# Patient Record
Sex: Male | Born: 1990
Health system: Southern US, Community
[De-identification: ages and names within clinical notes are randomized; demographics above are authoritative.]

## PROBLEM LIST (undated history)

## (undated) DIAGNOSIS — D869 Sarcoidosis, unspecified: Secondary | ICD-10-CM

## (undated) DIAGNOSIS — E611 Iron deficiency: Secondary | ICD-10-CM

## (undated) HISTORY — DX: Sarcoidosis, unspecified: D86.9

---

## 2016-11-15 DIAGNOSIS — M2550 Pain in unspecified joint: Secondary | ICD-10-CM | POA: Diagnosis not present

## 2016-11-15 DIAGNOSIS — M791 Myalgia: Secondary | ICD-10-CM | POA: Diagnosis not present

## 2017-01-06 ENCOUNTER — Encounter (HOSPITAL_COMMUNITY): Payer: Self-pay | Admitting: *Deleted

## 2017-01-06 ENCOUNTER — Emergency Department (HOSPITAL_COMMUNITY): Payer: BLUE CROSS/BLUE SHIELD

## 2017-01-06 ENCOUNTER — Emergency Department (HOSPITAL_COMMUNITY)
Admission: EM | Admit: 2017-01-06 | Discharge: 2017-01-06 | Disposition: A | Discharge status: Home / Self Care | Payer: BLUE CROSS/BLUE SHIELD | Attending: Emergency Medicine | Admitting: Emergency Medicine

## 2017-01-06 DIAGNOSIS — R0609 Other forms of dyspnea: Secondary | ICD-10-CM | POA: Insufficient documentation

## 2017-01-06 DIAGNOSIS — M2550 Pain in unspecified joint: Secondary | ICD-10-CM | POA: Insufficient documentation

## 2017-01-06 DIAGNOSIS — R059 Cough, unspecified: Secondary | ICD-10-CM

## 2017-01-06 DIAGNOSIS — R05 Cough: Secondary | ICD-10-CM | POA: Diagnosis not present

## 2017-01-06 DIAGNOSIS — F17210 Nicotine dependence, cigarettes, uncomplicated: Secondary | ICD-10-CM | POA: Insufficient documentation

## 2017-01-06 DIAGNOSIS — R0600 Dyspnea, unspecified: Secondary | ICD-10-CM

## 2017-01-06 DIAGNOSIS — M791 Myalgia: Secondary | ICD-10-CM | POA: Diagnosis not present

## 2017-01-06 DIAGNOSIS — R0602 Shortness of breath: Secondary | ICD-10-CM | POA: Diagnosis not present

## 2017-01-06 LAB — CBC WITH DIFFERENTIAL/PLATELET
Basophils Absolute: 0.1 10*3/uL (ref 0.0–0.1)
Basophils Relative: 1 %
Eosinophils Absolute: 0.3 10*3/uL (ref 0.0–0.7)
Eosinophils Relative: 3 %
HCT: 34.1 % — ABNORMAL LOW (ref 39.0–52.0)
Hemoglobin: 10.9 g/dL — ABNORMAL LOW (ref 13.0–17.0)
Lymphocytes Relative: 28 %
Lymphs Abs: 2.3 10*3/uL (ref 0.7–4.0)
MCH: 22.1 pg — ABNORMAL LOW (ref 26.0–34.0)
MCHC: 32 g/dL (ref 30.0–36.0)
MCV: 69.2 fL — ABNORMAL LOW (ref 78.0–100.0)
Monocytes Absolute: 0.5 10*3/uL (ref 0.1–1.0)
Monocytes Relative: 6 %
Neutro Abs: 5.1 10*3/uL (ref 1.7–7.7)
Neutrophils Relative %: 62 %
Platelets: 436 10*3/uL — ABNORMAL HIGH (ref 150–400)
RBC: 4.93 MIL/uL (ref 4.22–5.81)
RDW: 16.1 % — ABNORMAL HIGH (ref 11.5–15.5)
WBC: 8.3 10*3/uL (ref 4.0–10.5)

## 2017-01-06 LAB — COMPREHENSIVE METABOLIC PANEL
ALT: 76 U/L — ABNORMAL HIGH (ref 17–63)
AST: 52 U/L — ABNORMAL HIGH (ref 15–41)
Albumin: 3.5 g/dL (ref 3.5–5.0)
Alkaline Phosphatase: 94 U/L (ref 38–126)
Anion gap: 11 (ref 5–15)
BUN: 14 mg/dL (ref 6–20)
CO2: 21 mmol/L — ABNORMAL LOW (ref 22–32)
Calcium: 9.3 mg/dL (ref 8.9–10.3)
Chloride: 103 mmol/L (ref 101–111)
Creatinine, Ser: 0.86 mg/dL (ref 0.61–1.24)
GFR calc Af Amer: >60 mL/min (ref >60)
GFR calc non Af Amer: >60 mL/min (ref >60)
Glucose, Bld: 92 mg/dL (ref 65–99)
Potassium: 3.6 mmol/L (ref 3.5–5.1)
Sodium: 135 mmol/L (ref 135–145)
Total Bilirubin: 0.6 mg/dL (ref 0.3–1.2)
Total Protein: 7.7 g/dL (ref 6.5–8.1)

## 2017-01-06 LAB — TSH: TSH: 3.476 u[IU]/mL (ref 0.350–4.500)

## 2017-01-06 MED ORDER — FAMOTIDINE 20 MG PO TABS: 20.0000 mg | ORAL_TABLET | Freq: Two times a day (BID) | ORAL | 0 refills | Status: DC

## 2017-01-06 MED ORDER — PREDNISONE 10 MG PO TABS: ORAL_TABLET | ORAL | 0 refills | Status: DC

## 2017-01-06 MED ORDER — DIPHENHYDRAMINE HCL 25 MG PO CAPS: 25.0000 mg | ORAL_CAPSULE | Freq: Four times a day (QID) | ORAL | 0 refills | Status: DC | PRN

## 2017-01-06 NOTE — Discharge Instructions (Addendum)
Your chest x-ray showed changes that are consistent with sarcoidosis. This will require follow-up with a chest CT for further evaluation. This may be obtained by a primary care doctor or a pulmonologist. Follow up with either primary care or a pulmonologist for reevaluation of your symptoms and to obtain imaging. Alternate 600 mg of ibuprofen and [phone] mg of Tylenol every 3 hours as needed for your joint pains. Do not exceed 4000 mg of Tylenol daily. You may also apply ice or heat to the affected areas for comfort. Return to the ED immediately if any concerning signs or symptoms develop such as worsening persistent shortness of breath, fevers.

## 2017-01-06 NOTE — ED Notes (Signed)
Pt c/o cough x 3weeks . Also c/o swelling and joint pain that increses and decreases at extremity sites over last several months. Pt c/o sweling at left great toe.

## 2017-01-06 NOTE — ED Notes (Signed)
Pt states he understandsinstructions. Home stable with steadfy gait.

## 2017-01-06 NOTE — ED Triage Notes (Addendum)
Pt c/o chronic cough worse when in front of an air condition, pt c/o joint pain x3 mths & lack of appetite onset x 3 wks, pt denies SOB, denies n/v/d, A&O x4

## 2017-01-06 NOTE — ED Notes (Signed)
Pt returns from xray

## 2017-01-06 NOTE — ED Notes (Signed)
Patient transported to X-ray 

## 2017-01-06 NOTE — ED Provider Notes (Signed)
[facility]-EMERGENCY DEPT Provider Note   CSN: [id number] Arrival date & time: 01/06/17  7829  By signing my name below, I, Diona Browner, attest that this documentation has been prepared under the direction and in the presence of [facility], PA-C. Electronically Signed: Diona Browner, ED Scribe. 01/06/17. 1:03 PM.  History   Chief Complaint Chief Complaint  Patient presents with  . Joint Pain    HPI Ronald Walton is a 26 y.o. male who presents to the Emergency Department with multiple complaints. Firstly, he is complaining of intermittent, aching, joint pain to multiple joints for the last three months. Pt reports sudden right ankle swelling initially with no known injury that started in May 2018 and resolved after a few days. He took tylenol and the swelling went down. Since then, he states that he has been experiencing intermittent transient joint aches and swelling in multiple joints to his hands and feet. Today, he complains of aching pain in the left MCP joint and the balls of his feet. No trauma or falls. No numbness, tingling, or weakness. Associated sx include decreased appetite, unexpected weight loss (40 pounds in ~ 3 months), and muscle cramps in the thighs which occur at rest but are relieved by movement.  He also endorses cough for 3 weeks. Initially dry, now productive of green mucus intermittently. Cough is worse in an air conditioned setting. He has tried over-the-counter cough medication which has been somewhat helpful. Associated symptoms include chest tightness and DOE. Smokes 2 black and milds a day since he was 16. Smokes marijuana. Drinks EtOH socially. No other drug use.   Pt denies fever, abdominal pain, nausea, vomiting, diarrhea, or any other complaints at this time.   The history is provided by the patient. No language interpreter was used.    History reviewed. No pertinent past medical history.  There are no active problems to display for this  patient.   History reviewed. No pertinent surgical history.     Home Medications    Prior to Admission medications   Not on File    Family History No family history on file.  Social History Social History  Substance Use Topics  . Smoking status: Current Every Day Smoker    Packs/day: 0.25    Types: Cigarettes  . Smokeless tobacco: Never Used  . Alcohol use No     Allergies   Patient has no known allergies.   Review of Systems Review of Systems  Constitutional: Positive for activity change and unexpected weight change. Negative for fever.  Respiratory: Positive for cough and shortness of breath.   Cardiovascular: Positive for chest pain.  Gastrointestinal: Negative for abdominal pain, diarrhea, nausea and vomiting.  Musculoskeletal: Positive for arthralgias, joint swelling and myalgias.  All other systems reviewed and are negative.    Physical Exam Updated Vital Signs BP 138/69   Pulse 99   Temp 98.9 F (37.2 C)   Resp 16   SpO2 100%   Physical Exam  Constitutional: He appears well-developed and well-nourished. No distress.  Resting comfortably in chair  HENT:  Head: Normocephalic and atraumatic.  Eyes: Conjunctivae are normal. Right eye exhibits no discharge. Left eye exhibits no discharge.  Neck: Normal range of motion. Neck supple. No JVD present. No tracheal deviation present.  Cardiovascular: Normal rate, regular rhythm, normal heart sounds and intact distal pulses.   2+ radial and DP/PT pulses bl, negative Homan's bl   Pulmonary/Chest: Effort normal and breath sounds normal. No respiratory distress. He has  no wheezes. He has no rales. He exhibits no tenderness.  Abdominal: Soft. He exhibits no distension. There is no tenderness.  Musculoskeletal: Normal range of motion. He exhibits tenderness. He exhibits no edema.  Left first MTP joint tenderness palpation without warmth, erythema, or crepitus. No swelling appreciated to the extremities or  joints. Normal range of motion and 5/5 strength of BLE UE and BLE major muscle groups  Neurological: He is alert.  Fluent speech, no facial droop, sensation intact to soft touch of extremities. Normal gait, patient can heel walk and toe walk without difficulty. Good muscle tone  Skin: Skin is warm and dry. No rash noted. No erythema.  Psychiatric: He has a normal mood and affect. His behavior is normal.  Nursing note and vitals reviewed.    ED Treatments / Results  DIAGNOSTIC STUDIES: Oxygen Saturation is 100% on RA, normal by my interpretation.   COORDINATION OF CARE: 1:03 PM-Discussed next steps with pt. Pt verbalized understanding and is agreeable with the plan.   Labs (all labs ordered are listed, but only abnormal results are displayed) Labs Reviewed  COMPREHENSIVE METABOLIC PANEL - Abnormal; Notable for the following:       Result Value   CO2 21 (*)    AST 52 (*)    ALT 76 (*)    All other components within normal limits  CBC WITH DIFFERENTIAL/PLATELET - Abnormal; Notable for the following:    Hemoglobin 10.9 (*)    HCT 34.1 (*)    MCV 69.2 (*)    MCH 22.1 (*)    RDW 16.1 (*)    Platelets 436 (*)    All other components within normal limits  TSH  HIV ANTIBODY (ROUTINE TESTING)    EKG  EKG Interpretation None       Radiology Dg Chest 2 View  Result Date: 01/06/2017 CLINICAL DATA:  Cough. EXAM: CHEST  2 VIEW COMPARISON:  None. FINDINGS: Normal heart size. Bilateral hilar and right paratracheal lymphadenopathy. No focal consolidation, pleural effusion, or pneumothorax. No acute osseous abnormality. IMPRESSION: Bilateral hilar and right paratracheal lymphadenopathy, suggestive of stage I sarcoidosis. Recommend contrast-enhanced chest CT for further evaluation. Electronically Signed   By: Obie Dredge M.D.   On: 01/06/2017 13:25    Procedures Procedures (including critical care time)  Medications Ordered in ED Medications - No data to display   Initial  Impression / Assessment and Plan / ED Course  I have reviewed the triage vital signs and the nursing notes.  Pertinent labs & imaging results that were available during my care of the patient were reviewed by me and considered in my medical decision making (see chart for details).     Patient with multiple complaints including unexpected weight loss, intermittent joint pains in multiple joints, DOE, and cough. Afebrile, vital signs are stable, and SPO2 saturations 100% on room air while in the ED. He is in no acute respiratory distress. CBC shows a mild anemia which I do not think is contributing to his symptoms. CMP shows a mild elevation in AST and ALT, which patient states was noted on his lab work while at urgent care 1 month ago. This appears stable and unchanged. Joint pains do not appear to be from a traumatic source, no imaging required at this time. His chest x-ray shows bilateral hilar and right paratracheal lymphadenopathy which suggests stage I sarcoidosis. Recommendation of obtaining a chest CT with contrast for further evaluation. No further emergent workup required at this time. I spoke  with the patient and recommend follow-up with primary care or pulmonology for reevaluation and to obtain the CT on an outpatient basis. Discussed indications for return to the ED. Pt verbalized understanding of and agreement with plan and is safe for discharge home at this time.   Final Clinical Impressions(s) / ED Diagnoses   Final diagnoses:  Cough in adult patient  Multiple joint pain  DOE (dyspnea on exertion)    New Prescriptions There are no discharge medications for this patient.  I personally performed the services described in this documentation, which was scribed in my presence. The recorded information has been reviewed and is accurate.     Jeanie Sewer, PA-C 01/06/17 1602    Raeford Razor, MD 01/14/17 [phone]

## 2017-01-07 LAB — HIV ANTIBODY (ROUTINE TESTING W REFLEX): HIV Screen 4th Generation wRfx: NONREACTIVE

## 2017-02-13 NOTE — Progress Notes (Signed)
Subjective:    Patient ID: Ronald Walton, male    DOB: July 02, 1990, 26 y.o.   MRN: [id number]  HPI Patient was seen in the emergency department on 01/06/17 and was referred to my office with abnormal imaging. He reports starting in June he noticed intermittent swelling, stiffness, and pain in his ankles, feet, knees, hands, etc. He reports he does have erythema around his joints at times a well. He reports he then started to notice that he was losing weight without trying. About 2 months ago he noticed that he developed a nonproductive cough. Reportedly his cough is worse at work in a cool environment. He reports now his cough is only intermittently producing a "green" mucus at times but mostly is "white". He denies any rashes or abnormal bruising. No dysphagia or odynophagia. No reflux or dyspepsia. No eye swelling, erythema, pain or blurry vision. He does feel like he is weaker in his hands mostly. No focal numbness or tingling. He reports a "constant pressure" in his center, anterior chest. He denies any pleuritic component to his pain. He does cough more with deep breathing or laughing. He endorses dyspnea, especially with exertion. Cough does also seem to be worse with exertion. No fever or sweats. Does have chills at times. He has been taking Iron for anemia. No palpitations, syncope or near syncope except that he does have some near syncope at times upon standing quickly.   Review of Systems No dysuria, hematuria, or urinary hesitancy. He reports he is urinating more frequently lately at night - every 2-3 hours. No adenopathy in his neck, groin or axilla. A pertinent 14 point review of systems is negative except as per the history of presenting illness.  No Known Allergies  No current outpatient prescriptions on file prior to visit.   No current facility-administered medications on file prior to visit.     History reviewed. No pertinent past medical history.  History reviewed. No  pertinent surgical history.  Family History  Problem Relation Age of Onset  . Hypertension Mother   . Diabetes Father   . Hypertension Brother   . Sarcoidosis Cousin        Neuro Sarcoid   . Lung disease Neg Hx     Social History   Social History  . Marital status: Single    Spouse name: N/A  . Number of children: N/A  . Years of education: N/A   Social History Main Topics  . Smoking status: Current Every Day Smoker    Years: 6.00    Types: Cigars    Start date: 03/22/2010  . Smokeless tobacco: Never Used     Comment: small cigars - smokes 2 per day  . Alcohol use Yes     Comment: Social - weeks & events  . Drug use: Yes    Types: Marijuana  . Sexual activity: Not Asked   Other Topics Concern  . None   Social History Narrative   [facility] (02/14/17):   Originally from [place], [place]. Has always lived in [place]. 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 He comes in contact with industrial alcohol and acetone if anything. This is his first and only job. He has a degree in Systems analyst. No pets currently. No bird, mold, or hot tub exposure. Enjoys fishing and enjoying times with family.       Objective:   Physical Exam BP 122/80 (BP Location: Left Arm, Patient Position:  Sitting, Cuff Size: Normal)   Pulse (!) 109   Ht 5' 7.5" (1.715 m)   Wt 246 lb (111.6 kg)   SpO2 97%   BMI 37.96 kg/m  General:  Awake. Alert. No acute distress. African-American male. Integument:  Warm & dry. No rash on exposed skin. No bruising on exposed skin. Extremities:  No cyanosis or clubbing.  Lymphatics:  No appreciated cervical or supraclavicular lymphadenoapthy. HEENT:  Moist mucus membranes. No oral ulcers. No scleral injection or icterus.  Cardiovascular:  Regular rate and rhythm. No edema. No appreciable JVD.  Pulmonary:  Good aeration bilaterally. Questionable mild coarse apical wheeze. Symmetric chest wall expansion. No accessory muscle use  on room air. Abdomen: Soft. Normal bowel sounds. Nondistended. Grossly nontender. No hepatosplenomegaly appreciated. Musculoskeletal:  Normal bulk and tone. Hand grip, eyes sepsis, triceps, quadriceps, & hamstring strength 5/5 bilaterally. No joint deformity or effusion appreciated. Neurological:  CN 2-12 grossly in tact. No meningismus. Moving all 4 extremities equally. Symmetric brachioradialis deep tendon reflexes. Psychiatric:  Mood and affect congruent. Speech normal rhythm, rate & tone.   IMAGING CXR PA/LAT 01/06/17 (personally reviewed by me):  No parenchymal mass or opacity appreciated. No pleural effusion or thickening. Heart normal in size. Bilateral hilar fullness consistent with lymphadenopathy as well as right paratracheal fullness consistent with lymphadenopathy.  LABS 01/06/17 BMP: 135/3.6/103/21/14/0.86/92/9.3 LFT: 3.5/7.7/0.6/94/52/76 CBC: 8.3/10.9/34.1/436    Assessment & Plan:  26 y.o. male with what appears to be bilateral hilar adenopathy. Additionally, he also has anemia on his previous serum lab work. Patient's transaminases are mildly elevated. I am concerned that he could have an underlying malignancy/lymphoma but there does not appear to be a lymphocyte predominance on his differential. We discussed sarcoidosis but this is a diagnosis of exclusion and given his joint complaints as well as weight loss or other autoimmune diseases must be considered. We also briefly discussed the possible need for a biopsy and this could involve mediastinoscopy. I instructed the patient to contact me if there are questions or concerns before his next appointment.  1. Hilar adenopathy: Checking CT chest, abdomen, and pelvis with contrast. Checking serum LDH & angiotensin converting enzyme level. 2. Cough & dyspnea on exertion: Checking 6 minute walk test on room air and full pulmonary function testing. 3. Arthritis: Checking serum autoimmune workup with ANA, ESR, CRP, Smith antibody,  double-stranded DNA antibody, histone antibody, anti-CCP, and rheumatoid factor. 4. Follow-up: Patient to return to clinic in 2 weeks to review test results.  Sonia Baller Ashok Cordia, M.D. [facility] Pager:  [phone] After 3pm or if no response, call [phone] 4:01 PM 02/14/17

## 2017-02-14 ENCOUNTER — Encounter: Payer: Self-pay | Admitting: Pulmonary Disease

## 2017-02-14 ENCOUNTER — Ambulatory Visit (INDEPENDENT_AMBULATORY_CARE_PROVIDER_SITE_OTHER): Payer: BLUE CROSS/BLUE SHIELD | Admitting: Pulmonary Disease

## 2017-02-14 ENCOUNTER — Other Ambulatory Visit (INDEPENDENT_AMBULATORY_CARE_PROVIDER_SITE_OTHER): Payer: BLUE CROSS/BLUE SHIELD

## 2017-02-14 DIAGNOSIS — D869 Sarcoidosis, unspecified: Secondary | ICD-10-CM

## 2017-02-14 DIAGNOSIS — R059 Cough, unspecified: Secondary | ICD-10-CM

## 2017-02-14 DIAGNOSIS — M1990 Unspecified osteoarthritis, unspecified site: Secondary | ICD-10-CM

## 2017-02-14 DIAGNOSIS — R590 Localized enlarged lymph nodes: Secondary | ICD-10-CM

## 2017-02-14 DIAGNOSIS — R0609 Other forms of dyspnea: Secondary | ICD-10-CM

## 2017-02-14 DIAGNOSIS — R05 Cough: Secondary | ICD-10-CM

## 2017-02-14 DIAGNOSIS — R0600 Dyspnea, unspecified: Secondary | ICD-10-CM

## 2017-02-14 LAB — SEDIMENTATION RATE: Sed Rate: 93 mm/hr — ABNORMAL HIGH (ref 0–15)

## 2017-02-14 LAB — C-REACTIVE PROTEIN: CRP: 3.2 mg/dL (ref 0.5–20.0)

## 2017-02-14 NOTE — Patient Instructions (Addendum)
   Call or e-mail me if you have any new breathing problems or questions before your next appointment.   TESTS ORDERED: 1. CT CHEST/ABDOMEN/PELVIS WITH CONTRAST Full PFTs on or before next appointment 6MWT on room air on or before next appointment Serum LDH, ANA, ESR, CRP, ACE, Smith Antibody, DS DNA Antibody, Histone Antibody, Anti-CCP, & Rheumatoid Factor.

## 2017-02-17 LAB — ANA: ANA: NEGATIVE

## 2017-02-19 ENCOUNTER — Other Ambulatory Visit: Payer: Self-pay | Admitting: Pulmonary Disease

## 2017-02-19 DIAGNOSIS — R0600 Dyspnea, unspecified: Secondary | ICD-10-CM

## 2017-02-19 DIAGNOSIS — R05 Cough: Secondary | ICD-10-CM

## 2017-02-19 DIAGNOSIS — R0609 Other forms of dyspnea: Secondary | ICD-10-CM

## 2017-02-19 DIAGNOSIS — R059 Cough, unspecified: Secondary | ICD-10-CM

## 2017-02-19 LAB — ANGIOTENSIN CONVERTING ENZYME: ANGIOTENSIN-CONVERTING ENZYME: 72 U/L — AB (ref 9–67)

## 2017-02-19 LAB — HISTONE ANTIBODIES, IGG, BLOOD: Histone Ab: <1 U (ref <1.0)

## 2017-02-19 LAB — ANTI-SMITH ANTIBODY: ENA SM Ab Ser-aCnc: <1 AI

## 2017-02-19 LAB — CYCLIC CITRUL PEPTIDE ANTIBODY, IGG: Cyclic Citrullin Peptide Ab: <16 UNITS

## 2017-02-19 LAB — ANTI-DNA ANTIBODY, DOUBLE-STRANDED

## 2017-02-19 LAB — RHEUMATOID FACTOR: Rhuematoid fact SerPl-aCnc: <14 IU/mL (ref <14)

## 2017-02-19 LAB — LACTATE DEHYDROGENASE: LDH: 237 U/L — ABNORMAL HIGH (ref 100–220)

## 2017-02-20 ENCOUNTER — Ambulatory Visit (INDEPENDENT_AMBULATORY_CARE_PROVIDER_SITE_OTHER): Payer: BLUE CROSS/BLUE SHIELD | Admitting: Pulmonary Disease

## 2017-02-20 DIAGNOSIS — R059 Cough, unspecified: Secondary | ICD-10-CM

## 2017-02-20 DIAGNOSIS — R05 Cough: Secondary | ICD-10-CM

## 2017-02-20 DIAGNOSIS — R0609 Other forms of dyspnea: Secondary | ICD-10-CM

## 2017-02-20 DIAGNOSIS — R0600 Dyspnea, unspecified: Secondary | ICD-10-CM

## 2017-02-20 LAB — PULMONARY FUNCTION TEST
DL/VA % PRED: 94 %
DL/VA: 4.49 ml/min/mmHg/L
DLCO COR: 26.86 ml/min/mmHg
DLCO UNC % PRED: 77 %
DLCO cor % pred: 79 %
DLCO unc: 26.07 ml/min/mmHg
FEF 25-75 POST: 2.18 L/s
FEF 25-75 Pre: 2.14 L/sec
FEF2575-%CHANGE-POST: 2 %
FEF2575-%PRED-POST: 49 %
FEF2575-%Pred-Pre: 48 %
FEV1-%Change-Post: 0 %
FEV1-%PRED-PRE: 79 %
FEV1-%Pred-Post: 79 %
FEV1-POST: 3.19 L
FEV1-PRE: 3.16 L
FEV1FVC-%CHANGE-POST: 1 %
FEV1FVC-%PRED-PRE: 82 %
FEV6-%Change-Post: -1 %
FEV6-%Pred-Post: 95 %
FEV6-%Pred-Pre: 96 %
FEV6-Post: 4.5 L
FEV6-Pre: 4.55 L
FEV6FVC-%PRED-POST: 101 %
FEV6FVC-%Pred-Pre: 101 %
FVC-%Change-Post: -1 %
FVC-%PRED-POST: 94 %
FVC-%PRED-PRE: 95 %
FVC-PRE: 4.55 L
FVC-Post: 4.5 L
POST FEV1/FVC RATIO: 71 %
PRE FEV1/FVC RATIO: 70 %
Post FEV6/FVC ratio: 100 %
Pre FEV6/FVC Ratio: 100 %
RV % PRED: 171 %
RV: 2.8 L
TLC % PRED: 103 %
TLC: 7.37 L

## 2017-02-20 NOTE — Progress Notes (Signed)
PFT completed today 02/20/17.  

## 2017-02-21 ENCOUNTER — Ambulatory Visit (INDEPENDENT_AMBULATORY_CARE_PROVIDER_SITE_OTHER)
Admission: RE | Admit: 2017-02-21 | Discharge: 2017-02-21 | Disposition: A | Discharge status: Home / Self Care | Payer: BLUE CROSS/BLUE SHIELD | Source: Ambulatory Visit | Attending: Pulmonary Disease | Admitting: Pulmonary Disease

## 2017-02-21 DIAGNOSIS — D869 Sarcoidosis, unspecified: Secondary | ICD-10-CM

## 2017-02-21 DIAGNOSIS — J984 Other disorders of lung: Secondary | ICD-10-CM | POA: Diagnosis not present

## 2017-02-21 DIAGNOSIS — R1030 Lower abdominal pain, unspecified: Secondary | ICD-10-CM | POA: Diagnosis not present

## 2017-02-21 MED ORDER — IOPAMIDOL (ISOVUE-300) INJECTION 61%
100.0000 mL | Freq: Once | INTRAVENOUS | Status: AC | PRN
  Administered 2017-02-21: 100 mL via INTRAVENOUS

## 2017-02-21 MED ORDER — IOPAMIDOL (ISOVUE-370) INJECTION 76%: 100.0000 mL | Freq: Once | INTRAVENOUS | Status: DC | PRN

## 2017-02-24 ENCOUNTER — Other Ambulatory Visit: Payer: Self-pay

## 2017-02-24 DIAGNOSIS — R590 Localized enlarged lymph nodes: Secondary | ICD-10-CM

## 2017-03-05 ENCOUNTER — Ambulatory Visit: Payer: BLUE CROSS/BLUE SHIELD | Admitting: Acute Care

## 2017-03-05 ENCOUNTER — Encounter: Payer: BLUE CROSS/BLUE SHIELD | Admitting: Thoracic Surgery (Cardiothoracic Vascular Surgery)

## 2017-03-05 ENCOUNTER — Ambulatory Visit: Payer: BLUE CROSS/BLUE SHIELD

## 2017-03-06 ENCOUNTER — Encounter: Payer: Self-pay | Admitting: Acute Care

## 2017-03-06 ENCOUNTER — Ambulatory Visit (INDEPENDENT_AMBULATORY_CARE_PROVIDER_SITE_OTHER): Payer: BLUE CROSS/BLUE SHIELD | Admitting: *Deleted

## 2017-03-06 ENCOUNTER — Ambulatory Visit (INDEPENDENT_AMBULATORY_CARE_PROVIDER_SITE_OTHER): Payer: BLUE CROSS/BLUE SHIELD | Admitting: Acute Care

## 2017-03-06 DIAGNOSIS — R05 Cough: Secondary | ICD-10-CM | POA: Diagnosis not present

## 2017-03-06 DIAGNOSIS — R0609 Other forms of dyspnea: Secondary | ICD-10-CM

## 2017-03-06 DIAGNOSIS — R590 Localized enlarged lymph nodes: Secondary | ICD-10-CM

## 2017-03-06 DIAGNOSIS — R0600 Dyspnea, unspecified: Secondary | ICD-10-CM

## 2017-03-06 DIAGNOSIS — R059 Cough, unspecified: Secondary | ICD-10-CM

## 2017-03-06 MED ORDER — AZITHROMYCIN 250 MG PO TABS: ORAL_TABLET | ORAL | 0 refills | Status: DC

## 2017-03-06 NOTE — Progress Notes (Signed)
SIX MIN WALK 03/06/2017  Medications no medications  Supplimental Oxygen during Test? (L/min) No  Laps 8  Partial Lap (in Meters) 28  Baseline BP (sitting) 124/84  Baseline Heartrate 109  Baseline Dyspnea (Borg Scale) 0  Baseline Fatigue (Borg Scale) 0.5  Baseline SPO2 100  BP (sitting) 144/86  Heartrate 140  Dyspnea (Borg Scale) 0.5  Fatigue (Borg Scale) 0.5  SPO2 100  BP (sitting) 132/84  Heartrate 120  SPO2 100  Stopped or Paused before Six Minutes No  Distance Completed 412  Tech Comments: forehead probe used for the entirety of the test.  pt walked at a moderate-fast pace and did not stop or pause during the test.  pt denied any angina, dizziness, hip/leg/calf pain at end of test

## 2017-03-06 NOTE — Assessment & Plan Note (Addendum)
Continued cough with green to white secretions Wheezing upon exam  Spirometry normal with negative BD response Plan No prednisone for wheezing or cough  prior to biopsy as it may alter results Await results of biopsy to RO/RI Lymphoma vs Sarcoid to determine plan of care/ appropriate treatment.

## 2017-03-06 NOTE — Assessment & Plan Note (Addendum)
Per patient somewhat improved in the interval since last office visit Chest pain better Plan: No prednisone for wheezing or cough  prior to biopsy as it may alter results Await results of biopsy to RO/RI Lymphoma vs Sarcoid to determine plan of care.

## 2017-03-06 NOTE — Patient Instructions (Addendum)
It is nice to meet you today. We have reviewed your CT scan and your lab work. Follow up with Dr. Dorris FetchHendrickson tomorrow  as is scheduled to discuss biopsy. Z-pack, take as directed Follow up with Dr. Jamison NeighborNestor in 2 weeks after biopsy.  Please contact office for sooner follow up if symptoms do not improve or worsen or seek emergency care   No prednisone for wheezing prior to biopsy as it may alter results Await results of biopsy to RO/RI Lymphoma vs Sarcoid to determine plan of care.

## 2017-03-06 NOTE — Progress Notes (Signed)
History of Present Illness Ronald Walton is a 26 y.o. male former smoker ( Quit within the last few weeks) with anemia and imaging concerning for bilateral hilar adenopathy. He was seen as a consult  by Dr. Jamison Neighbor.  HPI Patient was seen in the emergency department on 01/06/17 and was referred to my office with abnormal imaging. He reports starting in June he noticed intermittent swelling, stiffness, and pain in his ankles, feet, knees, hands, etc. He reports he does have erythema around his joints at times a well. He reports he then started to notice that he was losing weight without trying. About 2 months ago he noticed that he developed a nonproductive cough. Reportedly his cough is worse at work in a cool environment. He reports now his cough is only intermittently producing a "green" mucus at times but mostly is "white". He denies any rashes or abnormal bruising. No dysphagia or odynophagia. No reflux or dyspepsia. No eye swelling, erythema, pain or blurry vision. He does feel like he is weaker in his hands mostly. No focal numbness or tingling. He reports a "constant pressure" in his center, anterior chest. He denies any pleuritic component to his pain. He does cough more with deep breathing or laughing. He endorses dyspnea, especially with exertion. Cough does also seem to be worse with exertion. No fever or sweats. Does have chills at times. He has been taking Iron for anemia. No palpitations, syncope or near syncope except that he does have some near syncope at times upon standing quickly.    03/06/2017 2 week follow up: Pt was seen by Dr. Jamison Neighbor 02/14/2017. He is here for follow up of labs and PFT's . Labs are documented as below. PFT's are essentially normal spirometry with no BD response. He has quit smoking since he has started work up for his pulmonary problems. I have congratulated him and encouraged him to remain smoke free. He states he has a continued cough, but no chest pain. He  sleeps on his stomach at night, which he states helps him cough less.Marland Kitchen He is coughing up green mucus first thing in the morning, and thick white mucus throughout the day. Nasal secretions are clear with green fleks .He denies any fever, he states he does have chills, and he is wheezing at intervals.Joint pain comes and goes.He states he has lost 50 pounds between May and August. He states since August his weight  has been stable at 240 pounds. He denies orthopnea or hemoptysis.   Per the patient, Dr. Jamison Neighbor has concerns about underlying malignancy/lymphoma. The patient's transaminases were mildly elevated at initial consultation. There did not appear to be a lymphocyte predominance on his differential. There is consideration that this may be sarcoidosis,( family history in a maternal aunt)  but Dr. Jamison Neighbor considers this a diagnosis of exclusion considering his joint complaints as well as weight loss.  Labs were done to exclude autoimmune etiology. Having reviewed lab work and CT chest, Dr. Jamison Neighbor has referred patient for biopsy/mediastoscopy with Dr. Dorris Fetch ay [facility]. The patient has an appointment tomorrow for consultation.   Test Results: Results for PARMVIR, BOOMER (MRN [id number]) as of 03/06/2017 10:45  Ref. Range 02/14/2017 16:26  Anit Nuclear Antibody(ANA) Latest Ref Range: NEGATIVE  NEGATIVE  Angiotensin-Converting Enzyme Latest Ref Range: 9 - 67 U/L 72 (H)  Cyclic Citrullin Peptide Ab Latest Units: UNITS <16  ds DNA Ab Latest Units: IU/mL <1  Histone Antibody Latest Ref Range: <1.0 U <1.0  RA Latex  Turbid. Latest Ref Range: <14 IU/mL <14  ENA SM Ab Ser-aCnc Latest Ref Range: <1.0 NEG AI <1.0 NEG    Additional Autoimmune lab work 02/14/2017 LDH 237 Sedimentation rate 93 Anti-Smith antibody; negative Anti-DNA antibody double-stranded; negative Histone AB, IgG: Negative CRP; 3.2 HIV: nonreactive TSH: 4.376   02/21/2017>>CT Chest IMPRESSION: CHEST CT  1. Bulky mediastinal  and bilateral hilar adenopathy associated with reticulonodular opacities and patchy areas consolidation in the lungs with an upper lobe predominance. Findings are consistent with stage II sarcoidosis with lymph node enlargement and parenchymal lung disease. 2. Bilateral gynecomastia. 3. No other abnormalities on the chest CT.  ABDOMEN AND PELVIS CT  1. No acute findings within the abdomen or pelvis. 2. Mild adenopathy noted in the inguinal regions and pelvis and along the periceliac chain. 3. No other abnormalities in the abdomen or pelvis.  CXR PA/LAT 01/06/17:   No parenchymal mass or opacity appreciated. No pleural effusion or thickening. Heart normal in size. Bilateral hilar fullness consistent with lymphadenopathy as well as right paratracheal fullness consistent with lymphadenopathy.  LABS 01/06/17 BMP: 135/3.6/103/21/14/0.86/92/9.3 LFT: 3.5/7.7/0.6/94/52/76 CBC: 8.3/10.9/34.1/436  CBC Latest Ref Rng & Units 01/06/2017  WBC 4.0 - 10.5 K/uL 8.3  Hemoglobin 13.0 - 17.0 g/dL 10.9(L)  Hematocrit 39.0 - 52.0 % 34.1(L)  Platelets 150 - 400 K/uL 436(H)    BMP Latest Ref Rng & Units 01/06/2017  Glucose 65 - 99 mg/dL 92  BUN 6 - 20 mg/dL 14  Creatinine 2.440.61 - 0.101.24 mg/dL 2.720.86  Sodium 536135 - 644145 mmol/L 135  Potassium 3.5 - 5.1 mmol/L 3.6  Chloride 101 - 111 mmol/L 103  CO2 22 - 32 mmol/L 21(L)  Calcium 8.9 - 10.3 mg/dL 9.3     PFT    Component Value Date/Time   FEV1PRE 3.16 02/20/2017 1346   FEV1POST 3.19 02/20/2017 1346   FVCPRE 4.55 02/20/2017 1346   FVCPOST 4.50 02/20/2017 1346   TLC 7.37 02/20/2017 1346   DLCOUNC 26.07 02/20/2017 1346   PREFEV1FVCRT 70 02/20/2017 1346   PSTFEV1FVCRT 71 02/20/2017 1346    Ct Chest W Contrast  Result Date: 02/22/2017 CLINICAL DATA:  Prior chest radiograph showed hilar adenopathy suspicious for sarcoidosis. CT for further evaluation. EXAM: CT CHEST, ABDOMEN, AND PELVIS WITH CONTRAST TECHNIQUE: Multidetector CT imaging of the chest,  abdomen and pelvis was performed following the standard protocol during bolus administration of intravenous contrast. CONTRAST:  100mL ISOVUE-300 IOPAMIDOL (ISOVUE-300) INJECTION 61% COMPARISON:  Chest radiograph, 01/06/2017 FINDINGS: CT CHEST FINDINGS Cardiovascular: Heart is normal in size and configuration. No pericardial effusion. Great vessels are within normal limits. Mediastinum/Nodes: There is bulky mediastinal and hilar adenopathy. Several reference measurements were made. There is a right peritracheal lymph node just above the azygos arch measuring 2.7 cm in short axis. A subcarinal node measures 2.5 cm in short axis. Right superior hilar lymph node measures 2.5 cm in short axis. Left infrahilar node measures 2.5 cm in short axis. No neck base or axillary masses or enlarged lymph nodes. Trachea is widely patent. Esophagus is unremarkable. Lungs/Pleura: There are reticulonodular type lung opacities with hazy intervening opacity, most evident in the upper lobes. There are additional patchy areas of consolidation adjacent to fissures. There is an area of pleural-based consolidation in the right lower lobe centered on image 99. No evidence of pulmonary edema. No pleural effusion or pneumothorax. Chest wall:  Bilateral gynecomastia. CT ABDOMEN PELVIS FINDINGS Hepatobiliary: No focal liver abnormality is seen. No gallstones, gallbladder wall thickening, or biliary dilatation. Pancreas:  Unremarkable. No pancreatic ductal dilatation or surrounding inflammatory changes. Spleen: Normal in size without focal abnormality. Adrenals/Urinary Tract: Adrenal glands are unremarkable. Kidneys are normal, without renal calculi, focal lesion, or hydronephrosis. Bladder is unremarkable. Stomach/Bowel: Stomach is within normal limits. Appendix appears normal. No evidence of bowel wall thickening, distention, or inflammatory changes. Vascular/Lymphatic: No vascular abnormality. There are prominent periceliac lymph nodes, largest  measuring 11 mm in short axis. Mildly enlarged right inguinal lymph node measuring 13 mm in short axis prominent external iliac chain lymph nodes, largest on the left measuring 9 mm. No other adenopathy. Reproductive: Normal. Other: No abdominal wall hernia or abnormality. No abdominopelvic ascites. MUSCULOSKELETAL FINDINGS Mild endplate spurring in the lower thoracic spine. No fracture or acute finding. No osteoblastic or osteolytic lesions. IMPRESSION: CHEST CT 1. Bulky mediastinal and bilateral hilar adenopathy associated with reticulonodular opacities and patchy areas consolidation in the lungs with an upper lobe predominance. Findings are consistent with stage II sarcoidosis with lymph node enlargement and parenchymal lung disease. 2. Bilateral gynecomastia. 3. No other abnormalities on the chest CT. ABDOMEN AND PELVIS CT 1. No acute findings within the abdomen or pelvis. 2. Mild adenopathy noted in the inguinal regions and pelvis and along the periceliac chain. 3. No other abnormalities in the abdomen or pelvis. Electronically Signed   By: Amie Portland M.D.   On: 02/22/2017 08:41   Ct Abdomen Pelvis W Contrast  Result Date: 02/22/2017 CLINICAL DATA:  Prior chest radiograph showed hilar adenopathy suspicious for sarcoidosis. CT for further evaluation. EXAM: CT CHEST, ABDOMEN, AND PELVIS WITH CONTRAST TECHNIQUE: Multidetector CT imaging of the chest, abdomen and pelvis was performed following the standard protocol during bolus administration of intravenous contrast. CONTRAST:  ISOVUE-300 IOPAMIDOL (ISOVUE-300) INJECTION 61% COMPARISON:  Chest radiograph, 01/06/2017 FINDINGS: CT CHEST FINDINGS Cardiovascular: Heart is normal in size and configuration. No pericardial effusion. Great vessels are within normal limits. Mediastinum/Nodes: There is bulky mediastinal and hilar adenopathy. Several reference measurements were made. There is a right peritracheal lymph node just above the azygos arch measuring  2.7 cm in short axis. A subcarinal node measures 2.5 cm in short axis. Right superior hilar lymph node measures 2.5 cm in short axis. Left infrahilar node measures 2.5 cm in short axis. No neck base or axillary masses or enlarged lymph nodes. Trachea is widely patent. Esophagus is unremarkable. Lungs/Pleura: There are reticulonodular type lung opacities with hazy intervening opacity, most evident in the upper lobes. There are additional patchy areas of consolidation adjacent to fissures. There is an area of pleural-based consolidation in the right lower lobe centered on image 99. No evidence of pulmonary edema. No pleural effusion or pneumothorax. Chest wall:  Bilateral gynecomastia. CT ABDOMEN PELVIS FINDINGS Hepatobiliary: No focal liver abnormality is seen. No gallstones, gallbladder wall thickening, or biliary dilatation. Pancreas: Unremarkable. No pancreatic ductal dilatation or surrounding inflammatory changes. Spleen: Normal in size without focal abnormality. Adrenals/Urinary Tract: Adrenal glands are unremarkable. Kidneys are normal, without renal calculi, focal lesion, or hydronephrosis. Bladder is unremarkable. Stomach/Bowel: Stomach is within normal limits. Appendix appears normal. No evidence of bowel wall thickening, distention, or inflammatory changes. Vascular/Lymphatic: No vascular abnormality. There are prominent periceliac lymph nodes, largest measuring 11 mm in short axis. Mildly enlarged right inguinal lymph node measuring 13 mm in short axis prominent external iliac chain lymph nodes, largest on the left measuring 9 mm. No other adenopathy. Reproductive: Normal. Other: No abdominal wall hernia or abnormality. No abdominopelvic ascites. MUSCULOSKELETAL FINDINGS Mild endplate spurring in the  lower thoracic spine. No fracture or acute finding. No osteoblastic or osteolytic lesions. IMPRESSION: CHEST CT 1. Bulky mediastinal and bilateral hilar adenopathy associated with reticulonodular opacities and  patchy areas consolidation in the lungs with an upper lobe predominance. Findings are consistent with stage II sarcoidosis with lymph node enlargement and parenchymal lung disease. 2. Bilateral gynecomastia. 3. No other abnormalities on the chest CT. ABDOMEN AND PELVIS CT 1. No acute findings within the abdomen or pelvis. 2. Mild adenopathy noted in the inguinal regions and pelvis and along the periceliac chain. 3. No other abnormalities in the abdomen or pelvis. Electronically Signed   By: Amie Portland M.D.   On: 02/22/2017 08:41     Past medical hx No past medical history on file.   Social History  Substance Use Topics  . Smoking status: Former Smoker    Years: 6.00    Types: Cigars    Start date: 03/22/2010  . Smokeless tobacco: Never Used     Comment: small cigars - smokes 2 per day  . Alcohol use Yes     Comment: Social - weeks & events    Mr.Kloepfer reports that he has quit smoking. His smoking use included Cigars. He started smoking about 6 years ago. He quit after 6.00 years of use. He has never used smokeless tobacco. He reports that he drinks alcohol. He reports that he uses drugs, including Marijuana.  Tobacco Cessation: Patient states he has quit smoking since his initial ED visit  in August 2018. I have encouraged him to remain smoke free  Past surgical hx, Family hx, Social hx all reviewed.  No current outpatient prescriptions on file prior to visit.   No current facility-administered medications on file prior to visit.      No Known Allergies  Review Of Systems:  Constitutional:   +  weight loss, No night sweats,  Fevers, + chills, fatigue, or  lassitude.  HEENT:   No headaches,  Difficulty swallowing,  Tooth/dental problems, or  Sore throat,                No sneezing, itching, ear ache, nasal congestion, post nasal drip,   CV:  No chest pain,  Orthopnea, PND, swelling in lower extremities, anasarca, dizziness, palpitations, syncope.   GI  No heartburn,  indigestion, abdominal pain, nausea, vomiting, diarrhea, change in bowel habits, loss of appetite, bloody stools.   Resp: No shortness of breath with exertion or at rest.  + excess mucus, + productive cough,  + non-productive cough,  No coughing up of blood.  + change in color of mucus.  + wheezing.  No chest wall deformity  Skin: no rash or lesions.  GU: no dysuria, change in color of urine, no urgency or frequency.  No flank pain, no hematuria   MS:  Less joint pain or swelling.  No decreased range of motion.  No back pain.  Psych:  No change in mood or affect. No depression or anxiety.  No memory loss.   Vital Signs BP 116/80 (BP Location: Left Arm, Cuff Size: Normal)   Pulse (!) 106   Ht 5' 9.5" (1.765 m)   Wt 241 lb 12.8 oz (109.7 kg)   SpO2 97%   BMI 35.20 kg/m    Physical Exam:  General- No distress,  A&Ox3, pleasant young man ENT: No sinus tenderness, TM clear, pale nasal mucosa, no oral exudate,no post nasal drip, no LAN Cardiac: S1, S2, regular rate and rhythm, no murmur Chest: +  wheeze/ No rales/ dullness; no accessory muscle use, no nasal flaring, no sternal retractions Abd.: Soft Non-tender, non-distended, BS + Ext: No clubbing cyanosis, edema Neuro:  normal strength, cranial nerves intact, appropriate Skin: No rashes, warm and dry Psych: normal mood and behavior,appropriately  anxious about possible lymphoma diagnosis   Assessment/Plan  Hilar adenopathy Cough slightly better Green secretions at times with endorsement of chills, but no fever Referral For mediastinal biopsy / consultation with Dr. Dorris Fetch CT scan and lab work reviewed in full with patient Plan Follow up with Dr. Dorris Fetch 03/07/2017  as is scheduled to discuss biopsy. Z-pack, take as directed Follow up with Dr. Jamison Neighbor in 2-3  weeks after biopsy.  Please contact office for sooner follow up if symptoms do not improve or worsen or seek emergency care   No prednisone for wheezing or  cough  prior to biopsy as it may alter results Await results of biopsy to RO/RI Lymphoma vs Sarcoid to determine plan of care.   Dyspnea on exertion Per patient somewhat improved in the interval since last office visit Chest pain better Plan: No prednisone for wheezing or cough  prior to biopsy as it may alter results Await results of biopsy to RO/RI Lymphoma vs Sarcoid to determine plan of care.   Cough Continued cough with green to white secretions Wheezing upon exam  Spirometry normal with negative BD response Plan No prednisone for wheezing or cough  prior to biopsy as it may alter results Await results of biopsy to RO/RI Lymphoma vs Sarcoid to determine plan of care/ appropriate treatment.     Bevelyn Ngo, NP 03/06/2017  5:34 PM

## 2017-03-06 NOTE — Assessment & Plan Note (Addendum)
Cough slightly better Green secretions at times with endorsement of chills, but no fever Referral For mediastinal biopsy / consultation with Dr. Dorris FetchHendrickson CT scan and lab work reviewed in full with patient Plan Follow up with Dr. Dorris FetchHendrickson 03/07/2017  as is scheduled to discuss biopsy. Z-pack, take as directed Follow up with Dr. Jamison NeighborNestor in 2-3  weeks after biopsy.  Please contact office for sooner follow up if symptoms do not improve or worsen or seek emergency care   No prednisone for wheezing or cough  prior to biopsy as it may alter results Await results of biopsy to RO/RI Lymphoma vs Sarcoid to determine plan of care.

## 2017-03-07 ENCOUNTER — Other Ambulatory Visit: Payer: Self-pay

## 2017-03-07 ENCOUNTER — Institutional Professional Consult (permissible substitution) (INDEPENDENT_AMBULATORY_CARE_PROVIDER_SITE_OTHER): Payer: BLUE CROSS/BLUE SHIELD | Admitting: Thoracic Surgery (Cardiothoracic Vascular Surgery)

## 2017-03-07 ENCOUNTER — Encounter: Payer: Self-pay | Admitting: Thoracic Surgery (Cardiothoracic Vascular Surgery)

## 2017-03-07 VITALS — BP 126/83 | HR 102 | Wt 241.0 lb

## 2017-03-07 DIAGNOSIS — R590 Localized enlarged lymph nodes: Secondary | ICD-10-CM

## 2017-03-07 NOTE — Progress Notes (Signed)
PCP is Patient, No Pcp Per Referring Provider is Roslynn Amble, MD  No chief complaint on file.   HPI: 26 year old sent for consultation regarding mediastinal adenopathy  Ronald Walton is a 26 year old man with no prior significant past medical history. He was in his usual state of health until this past summer when he developed swelling in his right ankle. He wasn't aware of any injury. He then started having intermittent swelling of different joints the would have 1 ankle than the other and then his knees. It then migrated to his elbows wrists and fingers. He then developed a cough productive of white sputum although he did have some green mucous last week and was started on antibiotics yesterday. He has not had any fevers, chills, or night sweats. He has had a loss of appetite and general malaise. He has lost 49 pounds over the past 6 months and 5 pounds over the past 3 months.  His cough worsened to the point he went to the emergency room at the end of August. Workup included a chest x-ray and then a CT of the chest which showed mediastinal and hilar adenopathy.  Zubrod Score: At the time of surgery this patient's most appropriate activity status/level should be described as: [x]     0    Normal activity, no symptoms []     1    Restricted in physical strenuous activity but ambulatory, able to do out light work []     2    Ambulatory and capable of self care, unable to do work activities, up and about >50 % of waking hours                              []     3    Only limited self care, in bed greater than 50% of waking hours []     4    Completely disabled, no self care, confined to bed or chair []     5    Moribund   No past medical history on file.  No past surgical history on file.  Family History  Problem Relation Age of Onset  . Hypertension Mother   . Diabetes Father   . Hypertension Brother   . Sarcoidosis Cousin        Neuro Sarcoid   . Lung disease Neg Hx     Social  History Social History  Substance Use Topics  . Smoking status: Former Smoker    Years: 6.00    Types: Cigars    Start date: 03/22/2010  . Smokeless tobacco: Never Used     Comment: small cigars - smokes 2 per day  . Alcohol use Yes     Comment: Social - weeks & events    Current Outpatient Prescriptions  Medication Sig Dispense Refill  . azithromycin (ZITHROMAX) 250 MG tablet Take 2 pills today then one a day for 4 additional days 6 tablet 0   No current facility-administered medications for this visit.     No Known Allergies  Review of Systems  Constitutional: Positive for activity change, appetite change and unexpected weight change. Negative for chills, diaphoresis and fever.  HENT: Negative for trouble swallowing and voice change.   Eyes: Negative for visual disturbance.  Respiratory: Positive for cough, shortness of breath and wheezing.        Chest fullness  Cardiovascular: Positive for chest pain (1 episode a couple of months ago).  Gastrointestinal: Negative  for abdominal pain and blood in stool.  Genitourinary: Negative for difficulty urinating and dysuria.  Musculoskeletal: Positive for arthralgias and joint swelling.  Neurological: Negative for seizures, syncope and weakness.  Hematological: Negative for adenopathy. Does not bruise/bleed easily.  All other systems reviewed and are negative.  Blood pressure 126/83, pulse 102, oxygen saturation 98% on room air Physical Exam  Constitutional: He appears well-developed and well-nourished. No distress.  HENT:  Head: Normocephalic and atraumatic.  Mouth/Throat: No oropharyngeal exudate.  Eyes: Conjunctivae and EOM are normal. No scleral icterus.  Neck: Neck supple. No thyromegaly present.  Cardiovascular: Normal rate, regular rhythm, normal heart sounds and intact distal pulses.   Lymphadenopathy:    He has no cervical adenopathy.  Vitals reviewed.    Diagnostic Tests: CT CHEST FINDINGS  Cardiovascular:  Heart is normal in size and configuration. No pericardial effusion. Great vessels are within normal limits.  Mediastinum/Nodes: There is bulky mediastinal and hilar adenopathy. Several reference measurements were made. There is a right peritracheal lymph node just above the azygos arch measuring 2.7 cm in short axis. A subcarinal node measures 2.5 cm in short axis. Right superior hilar lymph node measures 2.5 cm in short axis. Left infrahilar node measures 2.5 cm in short axis. No neck base or axillary masses or enlarged lymph nodes. Trachea is widely patent. Esophagus is unremarkable.  Lungs/Pleura: There are reticulonodular type lung opacities with hazy intervening opacity, most evident in the upper lobes. There are additional patchy areas of consolidation adjacent to fissures. There is an area of pleural-based consolidation in the right lower lobe centered on image 99. No evidence of pulmonary edema. No pleural effusion or pneumothorax.  Chest wall:  Bilateral gynecomastia.  CT ABDOMEN PELVIS FINDINGS  Hepatobiliary: No focal liver abnormality is seen. No gallstones, gallbladder wall thickening, or biliary dilatation.  Pancreas: Unremarkable. No pancreatic ductal dilatation or surrounding inflammatory changes.  Spleen: Normal in size without focal abnormality.  Adrenals/Urinary Tract: Adrenal glands are unremarkable. Kidneys are normal, without renal calculi, focal lesion, or hydronephrosis. Bladder is unremarkable.  Stomach/Bowel: Stomach is within normal limits. Appendix appears normal. No evidence of bowel wall thickening, distention, or inflammatory changes.  Vascular/Lymphatic: No vascular abnormality.  There are prominent periceliac lymph nodes, largest measuring 11 mm in short axis. Mildly enlarged right inguinal lymph node measuring 13 mm in short axis prominent external iliac chain lymph nodes, largest on the left measuring 9 mm. No other  adenopathy.  Reproductive: Normal.  Other: No abdominal wall hernia or abnormality. No abdominopelvic ascites.  MUSCULOSKELETAL FINDINGS  Mild endplate spurring in the lower thoracic spine. No fracture or acute finding. No osteoblastic or osteolytic lesions.  IMPRESSION: CHEST CT  1. Bulky mediastinal and bilateral hilar adenopathy associated with reticulonodular opacities and patchy areas consolidation in the lungs with an upper lobe predominance. Findings are consistent with stage II sarcoidosis with lymph node enlargement and parenchymal lung disease. 2. Bilateral gynecomastia. 3. No other abnormalities on the chest CT.  ABDOMEN AND PELVIS CT  1. No acute findings within the abdomen or pelvis. 2. Mild adenopathy noted in the inguinal regions and pelvis and along the periceliac chain. 3. No other abnormalities in the abdomen or pelvis.   Electronically Signed   By: Amie Portlandavid  Ormond M.D.   On: 02/22/2017 08:41 I have personally reviewed the CT images and concur with the findings noted above.  Impression: Ronald Walton is a 26 year old man with known as going back about 3-4 months. This initially presented as migratory  joint pain and swelling and then progressed to a persistent cough. A CT of the chest shows marked mediastinal and bilateral hilar adenopathy. He needs a tissue biopsy for diagnosis. Differential diagnosis includes Hodgkin's and non-Hodgkin's lymphoma, sarcoidosis, and atypical infections. The best option for biopsy is mediastinal endoscopy to obtain sufficient tissue to rule out lymphoma.  I discussed the proposed procedure of mediastinoscopy with Mr. Janee Morn. His mother also participated by telephone. I informed them of the general nature of the procedure including the need for general anesthesia and the incision to be used. We would plan to do this as an outpatient. He could be out of work as much as a week. I informed him of the indications, risks,  benefits, and alternatives. They understand the risks include, but are not limited to those associated with general anesthesia, as well as procedure specific risks such as bleeding, possible need for transfusion or larger incision, infection, recurrent nerve injury with hoarseness, the thorax, and an exceedingly rare cases death.  He accepts the risk and wishes to proceed.   We do need to complete his antibiotic course before doing the procedure. That will be next Thursday. He would like to have the procedure done on Wednesdays so that he misses as little work as possible.  Plan: Mediastinal endoscopy on Wednesday, 03/19/2017  Loreli Slot, MD [facility] [phone]

## 2017-03-07 NOTE — Progress Notes (Signed)
Note reviewed.  Donna ChristenJennings E. Jamison NeighborNestor, M.D. [facility] Pager:  [phone] After 7pm or if no response, call [phone] 7:10 AM 03/07/17

## 2017-03-13 NOTE — Pre-Procedure Instructions (Signed)
Ronald RainwaterLarry Jaquan Walton  03/13/2017    Your procedure is scheduled on Wednesday, October 31.  [facility]30 AM                  Your surgery or procedure is scheduled for 7:30 AM    Call this number if you have problems the morning of surgery:  [phone]- pre- op desk                  For any other questions, please call [phone], Monday - Friday 8 AM - 4 PM.              Remember:  Do not eat food or drink liquids after midnight Tuesday, October 30.   Take these medicines the morning of surgery with A SIP OF WATER:   May take if needed:acetaminophen (TYLENOL) ,    cetirizine (ZYRTEC).  STOP taking Aspirin , Aspirin Products (Goody Powder, Excedrin Migraine), Ibuprofen (Advil), Naproxen (Aleve), Vitamins and Herbal Products (ie Fish Oil) Special instructions:   [place]- Preparing For Surgery  Before surgery, you can play an important role. Because skin is not sterile, your skin needs to be as free of germs as possible. You can reduce the number of germs on your skin by washing with CHG (chlorahexidine gluconate) Soap before surgery.  CHG is an antiseptic cleaner which kills germs and bonds with the skin to continue killing germs even after washing.  Please do not use if you have an allergy to CHG or antibacterial soaps. If your skin becomes reddened/irritated stop using the CHG.  Do not shave (including legs and underarms) for at least 48 hours prior to first CHG shower. It is OK to shave your face.  Please follow these instructions carefully.   1. Shower the NIGHT BEFORE SURGERY and the MORNING OF SURGERY with CHG.   2. If you chose to wash your hair, wash your hair first as usual with your normal shampoo.  3. After you shampoo, rinse your hair and body thoroughly to remove the shampoo. 4.  5.    Wash your face and private area with the soap you use at home, then rinse.  6. Use CHG as you would any other liquid soap. You can apply  CHG directly to the skin and wash gently with a scrungie or a clean washcloth.   7. Apply the CHG Soap to your body ONLY FROM THE NECK DOWN.  Do not use on open wounds or open sores. Avoid contact with your eyes, ears, mouth and genitals (private parts). Wash Face and genitals (private parts)  with your normal soap.  8. Wash thoroughly, paying special attention to the area where your surgery will be performed.  9. Thoroughly rinse your body with warm water from the neck down.  10. DO NOT shower/wash with your normal soap after using and rinsing off the CHG Soap.  11. Pat yourself dry with a CLEAN TOWEL.  12. Wear CLEAN PAJAMAS to bed the night before surgery, wear comfortable clothes the morning of surgery  Place CLEAN SHEETS on your bed the night of your first shower and DO NOT SLEEP WITH PETS.  Day of Surgery: Shower as above Do not apply any deodorants/lotions. Powders or colognes. Please wear clean clothes to the hospital/surgery center.    Do not wear jewelry, make-up or nail polish.  Do not shave 48 hours prior to surgery.  Men may shave face and  neck.  Do not bring valuables to the hospital.  [facility] is not responsible for any belongings or valuables.  Contacts, dentures or bridgework may not be worn into surgery.  Leave your suitcase in the car.  After surgery it may be brought to your room.  For patients admitted to the hospital, discharge time will be determined by your treatment team.  Patients discharged the day of surgery will not be allowed to drive home.   Please read over the following fact sheets that you were given: Pain Booklet, Patient Instructions for Mupirocin Application, Coughing and Deep breathing, Surgical Site Infections.

## 2017-03-14 ENCOUNTER — Encounter (HOSPITAL_COMMUNITY)
Admission: RE | Admit: 2017-03-14 | Discharge: 2017-03-14 | Disposition: A | Discharge status: Home / Self Care | Payer: BLUE CROSS/BLUE SHIELD | Source: Ambulatory Visit | Attending: Thoracic Surgery (Cardiothoracic Vascular Surgery) | Admitting: Thoracic Surgery (Cardiothoracic Vascular Surgery)

## 2017-03-14 ENCOUNTER — Other Ambulatory Visit (HOSPITAL_COMMUNITY): Payer: Self-pay | Admitting: *Deleted

## 2017-03-14 ENCOUNTER — Telehealth: Payer: Self-pay | Admitting: *Deleted

## 2017-03-14 ENCOUNTER — Ambulatory Visit (HOSPITAL_COMMUNITY)
Admission: RE | Admit: 2017-03-14 | Discharge: 2017-03-14 | Disposition: A | Discharge status: Home / Self Care | Payer: BLUE CROSS/BLUE SHIELD | Source: Ambulatory Visit | Attending: Thoracic Surgery (Cardiothoracic Vascular Surgery) | Admitting: Thoracic Surgery (Cardiothoracic Vascular Surgery)

## 2017-03-14 ENCOUNTER — Encounter (HOSPITAL_COMMUNITY): Payer: Self-pay

## 2017-03-14 DIAGNOSIS — R590 Localized enlarged lymph nodes: Secondary | ICD-10-CM | POA: Diagnosis present

## 2017-03-14 DIAGNOSIS — R599 Enlarged lymph nodes, unspecified: Secondary | ICD-10-CM | POA: Insufficient documentation

## 2017-03-14 DIAGNOSIS — R079 Chest pain, unspecified: Secondary | ICD-10-CM | POA: Diagnosis not present

## 2017-03-14 HISTORY — DX: Iron deficiency: E61.1

## 2017-03-14 LAB — CBC
HEMATOCRIT: 40.2 % (ref 39.0–52.0)
Hemoglobin: 13.1 g/dL (ref 13.0–17.0)
MCH: 22.5 pg — ABNORMAL LOW (ref 26.0–34.0)
MCHC: 32.6 g/dL (ref 30.0–36.0)
MCV: 69.2 fL — AB (ref 78.0–100.0)
Platelets: 365 10*3/uL (ref 150–400)
RBC: 5.81 MIL/uL (ref 4.22–5.81)
RDW: 16.1 % — AB (ref 11.5–15.5)
WBC: 6.5 10*3/uL (ref 4.0–10.5)

## 2017-03-14 LAB — COMPREHENSIVE METABOLIC PANEL
ALK PHOS: 134 U/L — AB (ref 38–126)
ALT: 47 U/L (ref 17–63)
AST: 41 U/L (ref 15–41)
Albumin: 3.4 g/dL — ABNORMAL LOW (ref 3.5–5.0)
Anion gap: 8 (ref 5–15)
BILIRUBIN TOTAL: 0.4 mg/dL (ref 0.3–1.2)
BUN: 9 mg/dL (ref 6–20)
CALCIUM: 9.3 mg/dL (ref 8.9–10.3)
CO2: 24 mmol/L (ref 22–32)
CREATININE: 0.93 mg/dL (ref 0.61–1.24)
Chloride: 102 mmol/L (ref 101–111)
GFR calc Af Amer: >60 mL/min (ref >60)
Glucose, Bld: 101 mg/dL — ABNORMAL HIGH (ref 65–99)
POTASSIUM: 4 mmol/L (ref 3.5–5.1)
Sodium: 134 mmol/L — ABNORMAL LOW (ref 135–145)
TOTAL PROTEIN: 7.7 g/dL (ref 6.5–8.1)

## 2017-03-14 LAB — TYPE AND SCREEN
ABO/RH(D): A POS
Antibody Screen: NEGATIVE

## 2017-03-14 LAB — SURGICAL PCR SCREEN
MRSA, PCR: NEGATIVE
STAPHYLOCOCCUS AUREUS: NEGATIVE

## 2017-03-14 LAB — PROTIME-INR
INR: 1.02
PROTHROMBIN TIME: 13.3 s (ref 11.4–15.2)

## 2017-03-14 LAB — APTT: aPTT: 31 seconds (ref 24–36)

## 2017-03-14 LAB — ABO/RH: ABO/RH(D): A POS

## 2017-03-14 NOTE — Telephone Encounter (Signed)
Ronald Walton from Short Stay called to relate a lo grad temp of 100 during his preop workup. He has just finished a Zpack  prescribed by pulmonary. He is scheduled for a Mediastinoscopy on 02/19/17. I suggested that he notify us if his temp goes to 100.5 or above or notify pulmonary if he feels worse. Also he does need to get a thermometer for home use. I wiil call him on Monday to check on his status.

## 2017-03-14 NOTE — Progress Notes (Signed)
Pt denies cardiac history, chest pain or sob. Pt has been on antibiotics for past week for sinus congestion and cough. States he finished them yesterday. Today at this appt pt has temp of 100. EKG showed a heart rate of 105. Pt states he still has cough, still coughing up some clear sputum. States he still feels stuffy nasally at times and he can't smell. I spoke with Rica MastAngela Kabbe, NP about pt. She has asked me to have pt check his temperature at home and to call Dr. Sunday CornHendrickson's office if temp is >100.5. She also told me to tell him to call Kandice RobinsonsSarah Groce, NP's office to let them know that he is still having a cough, can't smell and had a temp of 100 today, since she was the one that prescribed the antibiotics. Pt states he will get a thermometer on his way home and will also call the pulmonology office with an update.

## 2017-03-14 NOTE — Pre-Procedure Instructions (Signed)
Lynda RainwaterLarry Jaquan Blecha   Your procedure is scheduled on Wednesday, March 19, 2017 at 7:30 AM.   [facility] at 5:30 AM.   Call this number if you have problems the morning of surgery: [phone]   Questions prior to day of surgery, please call [phone] between 8 & 4 PM.   Remember:  Do not eat food or drink liquids after midnight Tuesday, 03/18/17.   Take these medicines the morning of surgery with A SIP OF WATER: Tylenol - if needed, Cetirizine (Zyrtec) - if needed  Stop Multivitamins and NSAIDS (Ibuprofen, Aleve, etc) as of today.   Do not wear jewelry.  Do not wear lotions, powders, cologne or deodorant.  Men may shave face and neck.  Do not bring valuables to the hospital.  [facility] is not responsible for any belongings or valuables.  Contacts, dentures or bridgework may not be worn into surgery.  Leave your suitcase in the car.  After surgery it may be brought to your room.  For patients admitted to the hospital, discharge time will be determined by your treatment team.  Patients discharged the day of surgery will not be allowed to drive home.   [place] - Preparing for Surgery  Before surgery, you can play an important role.  Because skin is not sterile, your skin needs to be as free of germs as possible.  You can reduce the number of germs on you skin by washing with CHG (chlorahexidine gluconate) soap before surgery.  CHG is an antiseptic cleaner which kills germs and bonds with the skin to continue killing germs even after washing.  Please DO NOT use if you have an allergy to CHG or antibacterial soaps.  If your skin becomes reddened/irritated stop using the CHG and inform your nurse when you arrive at Short Stay.  Do not shave (including legs and underarms) for at least 48 hours prior to the first CHG shower.  You may shave your face.  Please follow these instructions carefully:   1.  Shower with CHG  Soap the night before surgery and the                    morning of Surgery.  2.  If you choose to wash your hair, wash your hair first as usual with your       normal shampoo.  3.  After you shampoo, rinse your hair and body thoroughly to remove the shampoo.  4.  Use CHG as you would any other liquid soap.  You can apply chg directly       to the skin and wash gently with scrungie or a clean washcloth.  5.  Apply the CHG Soap to your body ONLY FROM THE NECK DOWN.        Do not use on open wounds or open sores.  Avoid contact with your eyes, ears, mouth and genitals (private parts).  Wash genitals (private parts) with your normal soap.  6.  Wash thoroughly, paying special attention to the area where your surgery        will be performed.  7.  Thoroughly rinse your body with warm water from the neck down.  8.  DO NOT shower/wash with your normal soap after using and rinsing off       the CHG Soap.  9.  Pat yourself dry with a clean towel.  10.  Wear clean pajamas.            11.  Place clean sheets on your bed the night of your first shower and do not        sleep with pets.  Day of Surgery  Do not apply any lotions/deodorants the morning of surgery.  Please wear clean clothes to the hospital.   Please read over the fact sheets that you were given.

## 2017-03-17 ENCOUNTER — Other Ambulatory Visit (HOSPITAL_COMMUNITY): Payer: BLUE CROSS/BLUE SHIELD

## 2017-03-17 NOTE — Progress Notes (Signed)
Anesthesia Chart Review:  Pt is a 26 year old male scheduled for mediastinoscopy on 03/19/2017 with Charlett LangoSteven Hendrickson, MD  - Pulmonologist is Lawanda CousinsNestor Jennings, MD. Last office visit 03/06/17 with Kandice RobinsonsSarah Groce, NP who treated pt with zithromax for green sputum and chills but no fever.   PMH reviewed.   Medications reviewed: pt recently finished course of azithromycin.   BP 115/83   Pulse 97   Temp 37.8 C   Resp 20   Ht 5\' 11"  (1.803 m)   Wt 240 lb 9.6 oz (109.1 kg)   SpO2 98%   BMI 33.56 kg/m   Preoperative labs reviewed.    CXR 03/14/17: - Extensive adenopathy, stable. Perihilar region interstitial thickening, similar to recent CT but progressed from August 2018 chest radiograph. These findings may be compatible with sarcoidosis. - Atypical infection is a differential consideration. Underlying neoplasm also possible. - No airspace consolidations seen.  Heart size normal.   EKG 03/14/17: Sinus tachycardia (105 bpm)  CT chest, abdomen, pelvis 02/21/17:  CHEST CT 1. Bulky mediastinal and bilateral hilar adenopathy associated with reticulonodular opacities and patchy areas consolidation in the lungs with an upper lobe predominance. Findings are consistent with stage II sarcoidosis with lymph node enlargement and parenchymal lung disease. 2. Bilateral gynecomastia. 3. No other abnormalities on the chest CT. ABDOMEN AND PELVIS CT 1. No acute findings within the abdomen or pelvis. 2. Mild adenopathy noted in the inguinal regions and pelvis and along the periceliac chain. 3. No other abnormalities in the abdomen or pelvis.  At pre-admission testing, pt has slightly increased temperature at 37.8C (100F).  Still has cough but sputum is now clear; has nasal congestion and can't smell.  I spoke with Evaristo BuryJo Lene in Dr. Sunday CornHendrickson's office. She instructed that pt should check temp at home and report to their office if it is >100.5.  She also instructed to have pt contact Ms. Alexandria LodgeGroce who  prescribed zithromax recently to inform her of reduction but still presence of symptoms.  Evaristo BuryJo Lene will follow up with pt prior to procedure with Dr. Dorris FetchHendrickson.    If no signs/symptoms of acute illness day of procedure, I anticipate pt can proceed with surgery as scheduled.    Rica Mastngela Javeion Cannedy, FNP-BC [facility] Phone: [phone] 03/17/2017 3:22 PM

## 2017-03-19 ENCOUNTER — Ambulatory Visit (HOSPITAL_COMMUNITY)
Admission: RE | Admit: 2017-03-19 | Discharge: 2017-03-19 | Disposition: A | Discharge status: Home / Self Care | Payer: BLUE CROSS/BLUE SHIELD | Source: Ambulatory Visit | Attending: Thoracic Surgery (Cardiothoracic Vascular Surgery) | Admitting: Thoracic Surgery (Cardiothoracic Vascular Surgery)

## 2017-03-19 ENCOUNTER — Ambulatory Visit (HOSPITAL_COMMUNITY): Payer: BLUE CROSS/BLUE SHIELD | Admitting: Emergency Medicine

## 2017-03-19 ENCOUNTER — Encounter (HOSPITAL_COMMUNITY): Payer: Self-pay | Admitting: Certified Registered Nurse Anesthetist

## 2017-03-19 ENCOUNTER — Encounter (HOSPITAL_COMMUNITY)
Admission: RE | Disposition: A | Discharge status: Home / Self Care | Payer: Self-pay | Source: Ambulatory Visit | Attending: Thoracic Surgery (Cardiothoracic Vascular Surgery)

## 2017-03-19 ENCOUNTER — Ambulatory Visit (HOSPITAL_COMMUNITY): Payer: BLUE CROSS/BLUE SHIELD | Admitting: Anesthesiology

## 2017-03-19 DIAGNOSIS — N62 Hypertrophy of breast: Secondary | ICD-10-CM | POA: Diagnosis not present

## 2017-03-19 DIAGNOSIS — Z8249 Family history of ischemic heart disease and other diseases of the circulatory system: Secondary | ICD-10-CM | POA: Insufficient documentation

## 2017-03-19 DIAGNOSIS — I889 Nonspecific lymphadenitis, unspecified: Secondary | ICD-10-CM | POA: Diagnosis not present

## 2017-03-19 DIAGNOSIS — Z833 Family history of diabetes mellitus: Secondary | ICD-10-CM | POA: Diagnosis not present

## 2017-03-19 DIAGNOSIS — Z8489 Family history of other specified conditions: Secondary | ICD-10-CM | POA: Diagnosis not present

## 2017-03-19 DIAGNOSIS — R590 Localized enlarged lymph nodes: Secondary | ICD-10-CM

## 2017-03-19 DIAGNOSIS — J8410 Pulmonary fibrosis, unspecified: Secondary | ICD-10-CM | POA: Insufficient documentation

## 2017-03-19 DIAGNOSIS — Z87891 Personal history of nicotine dependence: Secondary | ICD-10-CM | POA: Insufficient documentation

## 2017-03-19 HISTORY — PX: MEDIASTINOSCOPY: SHX5086

## 2017-03-19 SURGERY — MEDIASTINOSCOPY
Anesthesia: General

## 2017-03-19 MED ORDER — ROCURONIUM BROMIDE 100 MG/10ML IV SOLN
INTRAVENOUS | Status: DC | PRN
  Administered 2017-03-19: 60 mg via INTRAVENOUS
  Administered 2017-03-19: 20 mg via INTRAVENOUS

## 2017-03-19 MED ORDER — PREDNISONE 10 MG PO TABS: 10.0000 mg | ORAL_TABLET | Freq: Every day | ORAL | Status: DC

## 2017-03-19 MED ORDER — PROMETHAZINE HCL 25 MG/ML IJ SOLN: 6.2500 mg | INTRAMUSCULAR | Status: DC | PRN

## 2017-03-19 MED ORDER — LACTATED RINGERS IV SOLN
INTRAVENOUS | Status: DC | PRN
  Administered 2017-03-19: 07:00:00 via INTRAVENOUS

## 2017-03-19 MED ORDER — SUGAMMADEX SODIUM 200 MG/2ML IV SOLN
INTRAVENOUS | Status: AC
  Filled 2017-03-19: qty 2

## 2017-03-19 MED ORDER — HYDROMORPHONE HCL 1 MG/ML IJ SOLN: 0.2500 mg | INTRAMUSCULAR | Status: DC | PRN

## 2017-03-19 MED ORDER — CHLORHEXIDINE GLUCONATE 4 % EX LIQD: 60.0000 mL | Freq: Once | CUTANEOUS | Status: DC

## 2017-03-19 MED ORDER — PROPOFOL 10 MG/ML IV BOLUS
INTRAVENOUS | Status: AC
  Filled 2017-03-19: qty 20

## 2017-03-19 MED ORDER — DEXAMETHASONE SODIUM PHOSPHATE 10 MG/ML IJ SOLN
INTRAMUSCULAR | Status: DC | PRN
  Administered 2017-03-19: 10 mg via INTRAVENOUS

## 2017-03-19 MED ORDER — DEXTROSE 5 % IV SOLN
INTRAVENOUS | Status: AC
  Filled 2017-03-19: qty 1.5

## 2017-03-19 MED ORDER — SUCCINYLCHOLINE CHLORIDE 200 MG/10ML IV SOSY
PREFILLED_SYRINGE | INTRAVENOUS | Status: AC
  Filled 2017-03-19: qty 10

## 2017-03-19 MED ORDER — ALBUTEROL SULFATE HFA 108 (90 BASE) MCG/ACT IN AERS
INHALATION_SPRAY | RESPIRATORY_TRACT | Status: DC | PRN
  Administered 2017-03-19: 2 via RESPIRATORY_TRACT

## 2017-03-19 MED ORDER — ONDANSETRON HCL 4 MG/2ML IJ SOLN
INTRAMUSCULAR | Status: DC | PRN
  Administered 2017-03-19: 4 mg via INTRAVENOUS

## 2017-03-19 MED ORDER — FENTANYL CITRATE (PF) 250 MCG/5ML IJ SOLN
INTRAMUSCULAR | Status: AC
  Filled 2017-03-19: qty 5

## 2017-03-19 MED ORDER — OXYCODONE HCL 5 MG PO TABS: 5.0000 mg | ORAL_TABLET | Freq: Once | ORAL | Status: DC | PRN

## 2017-03-19 MED ORDER — PREDNISONE 10 MG PO TABS: 10.0000 mg | ORAL_TABLET | Freq: Every day | ORAL | 5 refills | Status: DC

## 2017-03-19 MED ORDER — OXYCODONE HCL 5 MG PO TABS: 5.0000 mg | ORAL_TABLET | Freq: Four times a day (QID) | ORAL | Status: DC | PRN

## 2017-03-19 MED ORDER — SUGAMMADEX SODIUM 200 MG/2ML IV SOLN
INTRAVENOUS | Status: DC | PRN
  Administered 2017-03-19: 200 mg via INTRAVENOUS

## 2017-03-19 MED ORDER — DEXAMETHASONE SODIUM PHOSPHATE 10 MG/ML IJ SOLN
INTRAMUSCULAR | Status: AC
  Filled 2017-03-19: qty 1

## 2017-03-19 MED ORDER — ROCURONIUM BROMIDE 10 MG/ML (PF) SYRINGE
PREFILLED_SYRINGE | INTRAVENOUS | Status: AC
  Filled 2017-03-19: qty 5

## 2017-03-19 MED ORDER — EPHEDRINE 5 MG/ML INJ
INTRAVENOUS | Status: AC
  Filled 2017-03-19: qty 10

## 2017-03-19 MED ORDER — LIDOCAINE HCL (CARDIAC) 20 MG/ML IV SOLN
INTRAVENOUS | Status: DC | PRN
  Administered 2017-03-19: 20 mg via INTRATRACHEAL
  Administered 2017-03-19: 80 mg via INTRAVENOUS

## 2017-03-19 MED ORDER — OXYCODONE HCL 5 MG/5ML PO SOLN: 5.0000 mg | Freq: Once | ORAL | Status: DC | PRN

## 2017-03-19 MED ORDER — LIDOCAINE 2% (20 MG/ML) 5 ML SYRINGE
INTRAMUSCULAR | Status: AC
  Filled 2017-03-19: qty 5

## 2017-03-19 MED ORDER — FENTANYL CITRATE (PF) 100 MCG/2ML IJ SOLN
INTRAMUSCULAR | Status: DC | PRN
  Administered 2017-03-19: 100 ug via INTRAVENOUS
  Administered 2017-03-19: 150 ug via INTRAVENOUS
  Administered 2017-03-19 (×3): 50 ug via INTRAVENOUS

## 2017-03-19 MED ORDER — PHENYLEPHRINE 40 MCG/ML (10ML) SYRINGE FOR IV PUSH (FOR BLOOD PRESSURE SUPPORT)
PREFILLED_SYRINGE | INTRAVENOUS | Status: AC
  Filled 2017-03-19: qty 10

## 2017-03-19 MED ORDER — ONDANSETRON HCL 4 MG/2ML IJ SOLN
INTRAMUSCULAR | Status: AC
  Filled 2017-03-19: qty 2

## 2017-03-19 MED ORDER — 0.9 % SODIUM CHLORIDE (POUR BTL) OPTIME
TOPICAL | Status: DC | PRN
  Administered 2017-03-19: 1000 mL

## 2017-03-19 MED ORDER — OXYCODONE HCL 5 MG PO TABS: 5.0000 mg | ORAL_TABLET | Freq: Four times a day (QID) | ORAL | 0 refills | Status: DC | PRN

## 2017-03-19 MED ORDER — MIDAZOLAM HCL 2 MG/2ML IJ SOLN
INTRAMUSCULAR | Status: AC
  Filled 2017-03-19: qty 2

## 2017-03-19 MED ORDER — MIDAZOLAM HCL 5 MG/5ML IJ SOLN
INTRAMUSCULAR | Status: DC | PRN
  Administered 2017-03-19: 2 mg via INTRAVENOUS

## 2017-03-19 MED ORDER — DEXTROSE 5 % IV SOLN
1.5000 g | INTRAVENOUS | Status: AC
  Administered 2017-03-19: 1.5 g via INTRAVENOUS

## 2017-03-19 SURGICAL SUPPLY — 36 items
APPLIER CLIP LOGIC TI 5 (MISCELLANEOUS) IMPLANT
CANISTER SUCT 3000ML PPV (MISCELLANEOUS) ×2 IMPLANT
CLIP VESOCCLUDE MED 6/CT (CLIP) ×2 IMPLANT
CONT SPEC 4OZ CLIKSEAL STRL BL (MISCELLANEOUS) ×8 IMPLANT
COVER SURGICAL LIGHT HANDLE (MISCELLANEOUS) ×4 IMPLANT
DERMABOND ADVANCED (GAUZE/BANDAGES/DRESSINGS) ×1
DERMABOND ADVANCED .7 DNX12 (GAUZE/BANDAGES/DRESSINGS) ×1 IMPLANT
DRAPE CHEST BREAST 15X10 FENES (DRAPES) ×2 IMPLANT
DRSG TEGADERM 2-3/8X2-3/4 SM (GAUZE/BANDAGES/DRESSINGS) ×2 IMPLANT
ELECT REM PT RETURN 9FT ADLT (ELECTROSURGICAL) ×2
ELECTRODE REM PT RTRN 9FT ADLT (ELECTROSURGICAL) ×1 IMPLANT
GAUZE SPONGE 2X2 8PLY STRL LF (GAUZE/BANDAGES/DRESSINGS) ×1 IMPLANT
GAUZE SPONGE 4X4 16PLY XRAY LF (GAUZE/BANDAGES/DRESSINGS) ×2 IMPLANT
GLOVE SURG SIGNA 7.5 PF LTX (GLOVE) ×2 IMPLANT
GOWN STRL REUS W/ TWL LRG LVL3 (GOWN DISPOSABLE) ×1 IMPLANT
GOWN STRL REUS W/ TWL XL LVL3 (GOWN DISPOSABLE) ×1 IMPLANT
GOWN STRL REUS W/TWL LRG LVL3 (GOWN DISPOSABLE) ×1
GOWN STRL REUS W/TWL XL LVL3 (GOWN DISPOSABLE) ×1
HEMOSTAT SURGICEL 2X14 (HEMOSTASIS) ×2 IMPLANT
KIT BASIN OR (CUSTOM PROCEDURE TRAY) ×2 IMPLANT
KIT ROOM TURNOVER OR (KITS) ×2 IMPLANT
NS IRRIG 1000ML POUR BTL (IV SOLUTION) ×2 IMPLANT
PACK GENERAL/GYN (CUSTOM PROCEDURE TRAY) ×2 IMPLANT
PAD ARMBOARD 7.5X6 YLW CONV (MISCELLANEOUS) ×4 IMPLANT
SPONGE GAUZE 2X2 STER 10/PKG (GAUZE/BANDAGES/DRESSINGS) ×1
SPONGE INTESTINAL PEANUT (DISPOSABLE) IMPLANT
SUT SILK 2 0 (SUTURE)
SUT SILK 2-0 18XBRD TIE 12 (SUTURE) IMPLANT
SUT VIC AB 2-0 CT1 27 (SUTURE) ×1
SUT VIC AB 2-0 CT1 TAPERPNT 27 (SUTURE) ×1 IMPLANT
SUT VIC AB 3-0 SH 18 (SUTURE) ×2 IMPLANT
SUT VICRYL 4-0 PS2 18IN ABS (SUTURE) ×2 IMPLANT
SYR 10ML LL (SYRINGE) ×2 IMPLANT
SYRINGE 20CC LL (MISCELLANEOUS) ×2 IMPLANT
TOWEL GREEN STERILE (TOWEL DISPOSABLE) ×2 IMPLANT
WATER STERILE IRR 1000ML POUR (IV SOLUTION) ×2 IMPLANT

## 2017-03-19 NOTE — H&P (View-Only) (Signed)
PCP is Patient, No Pcp Per Referring Provider is Roslynn Amble, MD  No chief complaint on file.   HPI: 26 year old sent for consultation regarding mediastinal adenopathy  Ronald Walton is a 26 year old man with no prior significant past medical history. He was in his usual state of health until this past summer when he developed swelling in his right ankle. He wasn't aware of any injury. He then started having intermittent swelling of different joints the would have 1 ankle than the other and then his knees. It then migrated to his elbows wrists and fingers. He then developed a cough productive of white sputum although he did have some green mucous last week and was started on antibiotics yesterday. He has not had any fevers, chills, or night sweats. He has had a loss of appetite and general malaise. He has lost 49 pounds over the past 6 months and 5 pounds over the past 3 months.  His cough worsened to the point he went to the emergency room at the end of August. Workup included a chest x-ray and then a CT of the chest which showed mediastinal and hilar adenopathy.  Zubrod Score: At the time of surgery this patient's most appropriate activity status/level should be described as: [x]     0    Normal activity, no symptoms []     1    Restricted in physical strenuous activity but ambulatory, able to do out light work []     2    Ambulatory and capable of self care, unable to do work activities, up and about >50 % of waking hours                              []     3    Only limited self care, in bed greater than 50% of waking hours []     4    Completely disabled, no self care, confined to bed or chair []     5    Moribund   No past medical history on file.  No past surgical history on file.  Family History  Problem Relation Age of Onset  . Hypertension Mother   . Diabetes Father   . Hypertension Brother   . Sarcoidosis Cousin        Neuro Sarcoid   . Lung disease Neg Hx     Social  History Social History  Substance Use Topics  . Smoking status: Former Smoker    Years: 6.00    Types: Cigars    Start date: 03/22/2010  . Smokeless tobacco: Never Used     Comment: small cigars - smokes 2 per day  . Alcohol use Yes     Comment: Social - weeks & events    Current Outpatient Prescriptions  Medication Sig Dispense Refill  . azithromycin (ZITHROMAX) 250 MG tablet Take 2 pills today then one a day for 4 additional days 6 tablet 0   No current facility-administered medications for this visit.     No Known Allergies  Review of Systems  Constitutional: Positive for activity change, appetite change and unexpected weight change. Negative for chills, diaphoresis and fever.  HENT: Negative for trouble swallowing and voice change.   Eyes: Negative for visual disturbance.  Respiratory: Positive for cough, shortness of breath and wheezing.        Chest fullness  Cardiovascular: Positive for chest pain (1 episode a couple of months ago).  Gastrointestinal: Negative  for abdominal pain and blood in stool.  Genitourinary: Negative for difficulty urinating and dysuria.  Musculoskeletal: Positive for arthralgias and joint swelling.  Neurological: Negative for seizures, syncope and weakness.  Hematological: Negative for adenopathy. Does not bruise/bleed easily.  All other systems reviewed and are negative.  Blood pressure 126/83, pulse 102, oxygen saturation 98% on room air Physical Exam  Constitutional: He appears well-developed and well-nourished. No distress.  HENT:  Head: Normocephalic and atraumatic.  Mouth/Throat: No oropharyngeal exudate.  Eyes: Conjunctivae and EOM are normal. No scleral icterus.  Neck: Neck supple. No thyromegaly present.  Cardiovascular: Normal rate, regular rhythm, normal heart sounds and intact distal pulses.   Lymphadenopathy:    He has no cervical adenopathy.  Vitals reviewed.    Diagnostic Tests: CT CHEST FINDINGS  Cardiovascular:  Heart is normal in size and configuration. No pericardial effusion. Great vessels are within normal limits.  Mediastinum/Nodes: There is bulky mediastinal and hilar adenopathy. Several reference measurements were made. There is a right peritracheal lymph node just above the azygos arch measuring 2.7 cm in short axis. A subcarinal node measures 2.5 cm in short axis. Right superior hilar lymph node measures 2.5 cm in short axis. Left infrahilar node measures 2.5 cm in short axis. No neck base or axillary masses or enlarged lymph nodes. Trachea is widely patent. Esophagus is unremarkable.  Lungs/Pleura: There are reticulonodular type lung opacities with hazy intervening opacity, most evident in the upper lobes. There are additional patchy areas of consolidation adjacent to fissures. There is an area of pleural-based consolidation in the right lower lobe centered on image 99. No evidence of pulmonary edema. No pleural effusion or pneumothorax.  Chest wall:  Bilateral gynecomastia.  CT ABDOMEN PELVIS FINDINGS  Hepatobiliary: No focal liver abnormality is seen. No gallstones, gallbladder wall thickening, or biliary dilatation.  Pancreas: Unremarkable. No pancreatic ductal dilatation or surrounding inflammatory changes.  Spleen: Normal in size without focal abnormality.  Adrenals/Urinary Tract: Adrenal glands are unremarkable. Kidneys are normal, without renal calculi, focal lesion, or hydronephrosis. Bladder is unremarkable.  Stomach/Bowel: Stomach is within normal limits. Appendix appears normal. No evidence of bowel wall thickening, distention, or inflammatory changes.  Vascular/Lymphatic: No vascular abnormality.  There are prominent periceliac lymph nodes, largest measuring 11 mm in short axis. Mildly enlarged right inguinal lymph node measuring 13 mm in short axis prominent external iliac chain lymph nodes, largest on the left measuring 9 mm. No other  adenopathy.  Reproductive: Normal.  Other: No abdominal wall hernia or abnormality. No abdominopelvic ascites.  MUSCULOSKELETAL FINDINGS  Mild endplate spurring in the lower thoracic spine. No fracture or acute finding. No osteoblastic or osteolytic lesions.  IMPRESSION: CHEST CT  1. Bulky mediastinal and bilateral hilar adenopathy associated with reticulonodular opacities and patchy areas consolidation in the lungs with an upper lobe predominance. Findings are consistent with stage II sarcoidosis with lymph node enlargement and parenchymal lung disease. 2. Bilateral gynecomastia. 3. No other abnormalities on the chest CT.  ABDOMEN AND PELVIS CT  1. No acute findings within the abdomen or pelvis. 2. Mild adenopathy noted in the inguinal regions and pelvis and along the periceliac chain. 3. No other abnormalities in the abdomen or pelvis.   Electronically Signed   By: Amie Portlandavid  Ormond M.D.   On: 02/22/2017 08:41 I have personally reviewed the CT images and concur with the findings noted above.  Impression: Ronald Walton is a 26 year old man with known as going back about 3-4 months. This initially presented as migratory  joint pain and swelling and then progressed to a persistent cough. A CT of the chest shows marked mediastinal and bilateral hilar adenopathy. He needs a tissue biopsy for diagnosis. Differential diagnosis includes Hodgkin's and non-Hodgkin's lymphoma, sarcoidosis, and atypical infections. The best option for biopsy is mediastinal endoscopy to obtain sufficient tissue to rule out lymphoma.  I discussed the proposed procedure of mediastinoscopy with Mr. Janee Morn. His mother also participated by telephone. I informed them of the general nature of the procedure including the need for general anesthesia and the incision to be used. We would plan to do this as an outpatient. He could be out of work as much as a week. I informed him of the indications, risks,  benefits, and alternatives. They understand the risks include, but are not limited to those associated with general anesthesia, as well as procedure specific risks such as bleeding, possible need for transfusion or larger incision, infection, recurrent nerve injury with hoarseness, the thorax, and an exceedingly rare cases death.  He accepts the risk and wishes to proceed.   We do need to complete his antibiotic course before doing the procedure. That will be next Thursday. He would like to have the procedure done on Wednesdays so that he misses as little work as possible.  Plan: Mediastinal endoscopy on Wednesday, 03/19/2017  Loreli Slot, MD [facility] [phone]

## 2017-03-19 NOTE — Op Note (Signed)
NAMKellie Walton:  Walton, Ronald              ACCOUNT NO.:  [id number]  MEDICAL RECORD NO.:  [id number]  LOCATION:                                 FACILITY:  PHYSICIAN:  Salvatore DecentSteven C. Dorris FetchHendrickson, M.D.DATE OF BIRTH:  09/12/1990  DATE OF PROCEDURE:  03/19/2017 DATE OF DISCHARGE:                              OPERATIVE REPORT   PREOPERATIVE DIAGNOSES:  Mediastinal and hilar adenopathy.  POSTOPERATIVE DIAGNOSIS:  Noncaseating granulomas consistent with sarcoidosis.  PROCEDURE:  Mediastinoscopy.  SURGEON:  Salvatore DecentSteven C. Dorris FetchHendrickson, MD.  ASSISTANT:  None.  ANESTHESIA:  General.  FINDINGS:  Inflammatory reaction around nodes.  4R node was markedly enlarged, white in color, firm and granular in texture.  Frozen section showed noncaseating granulomas.  CLINICAL NOTE:  Mr. Ronald Walton is a 26 year old man who presented initially with swelling in his right ankle.  He then would have intermittent swelling of different joints.  He developed a productive cough and had significant weight loss in association with a poor appetite.  At the end of August, his cough worsened to the point he went to the emergency room.  A chest x-ray and then a CT of the chest showed mediastinal and hilar adenopathy.  He was referred for surgical biopsy. The indications, risks, benefits, and alternatives of mediastinoscopy were discussed in detail with the patient.  He understood this was a diagnostic and not a therapeutic procedure.  He accepted the risks and agreed to proceed.  OPERATIVE NOTE:  Mr. Ronald Walton was brought to the operating room on March 19, 2017.  He had induction of general anesthesia and was intubated.  Intravenous antibiotics were administered.  Sequential compression devices were placed on the calves for DVT prophylaxis.  A shoulder roll was placed.  The neck and chest were prepped and draped in usual sterile fashion.  After performing a time-out, a transverse incision was made 1 fingerbreadth above the  sternal notch.  It was carried through the skin and subcutaneous tissue.  Hemostasis was achieved with electrocautery. The strap muscles were identified and separated in the midline.  The pretracheal fascia was identified and incised.  The pretracheal plane was developed bluntly into the mediastinum.  Mediastinoscope was inserted and systematic inspection of the mediastinum was carried out. There was dense fibrous tissue in the mediastinum.  There also were multiple blood vessels.  It was quite difficult to get the overlying tissue off the right paratracheal node, but once that was accomplished there was a markedly enlarged node that was white, hard and had a granular texture when biopsied.  A biopsy was taken and sent for AFB and fungal cultures.  Additional biopsies were taken and sent for frozen section and finally multiple additional biopsies were taken and sent for permanent pathology.  There was some bleeding.  Surgicel was applied and the wound was packed.  After 5 minutes, the packing was removed.  The mediastinoscope was reinserted.  There was no ongoing bleeding.  A second 4R node was identified and biopsies were obtained from it and sent for permanent pathology only.  This was similar in character to the previous node.  The frozen section returned showing noncaseating granulomas consistent with sarcoidosis.  The wound was again  packed for 5 minutes.  The packing was removed.  A final inspection was made with the mediastinoscope and there was no ongoing bleeding.  The scope was withdrawn.  The incision was closed with 3-0 Vicryl sutures in the platysmal layer followed by a 4-0 Vicryl subcuticular suture.  Dermabond was applied.  The patient was extubated in the operating room and taken to the postanesthetic care unit in good condition.     Salvatore Decent Dorris Fetch, M.D.     SCH/MEDQ  D:  03/19/2017  T:  03/19/2017  Job:  [id number]

## 2017-03-19 NOTE — Discharge Instructions (Addendum)
Do not drive or engage in heavy physical activity for 48 hours.  You may drive beginning 16/111/2, but should not drive within 6 hours of taking a pain pill  There is a medical adhesive over the incision. It will begin to peel off in 10-14 days.  You may shower tomorrow.  Call [phone] if you develop chest pain, shortness of breath or fever > 101 F  Call if you notice excessive swelling, redness or drainage from the incision.  You have a prescription for oxycodone, a narcotic pain reliever. You may use as directed.  You may use acetaminophen (Tylenol) or ibuprofen (Advil, Motrin) in addition to, or instead of, the oxycodone  My office will contact you with a follow up appointment.  You have a prescription for prednisone, an anti-inflammatory steroid. Continue that until you follow up with Pulmonology.

## 2017-03-19 NOTE — Transfer of Care (Signed)
Immediate Anesthesia Transfer of Care Note  Patient: Ronald RainwaterLarry Jaquan Walton  Procedure(s) Performed: MEDIASTINOSCOPY (N/A )  Patient Location: PACU  Anesthesia Type:General  Level of Consciousness: awake, alert , oriented and patient cooperative  Airway & Oxygen Therapy: Patient Spontanous Breathing and Patient connected to face mask oxygen  Post-op Assessment: Report given to RN and Post -op Vital signs reviewed and stable  Post vital signs: Reviewed and stable  Last Vitals:  Vitals:   03/19/17 0548  BP: (!) 141/80  Pulse: 100  Resp: 17  Temp: 36.7 C  SpO2: 100%    Last Pain:  Vitals:   03/19/17 0548  TempSrc: Oral         Complications: No apparent anesthesia complications

## 2017-03-19 NOTE — Anesthesia Procedure Notes (Signed)
Arterial Line Insertion Start/End10/31/2018 7:00 AM, 03/19/2017 7:05 AM Performed by: Adonis HousekeeperNGELL, Lyle Niblett M, CRNA  Patient location: Pre-op. Preanesthetic checklist: patient identified, IV checked, site marked, risks and benefits discussed, surgical consent, monitors and equipment checked and pre-op evaluation Lidocaine 1% used for infiltration and patient sedated Left, radial was placed Catheter size: 20 G Hand hygiene performed  and maximum sterile barriers used  Allen's test indicative of satisfactory collateral circulation Attempts: 1 Procedure performed using ultrasound guided technique. Following insertion, dressing applied and Biopatch. Post procedure assessment: normal

## 2017-03-19 NOTE — Anesthesia Postprocedure Evaluation (Signed)
Anesthesia Post Note  Patient: Ronald RainwaterLarry Jaquan Walton  Procedure(s) Performed: MEDIASTINOSCOPY (N/A )     Patient location during evaluation: PACU Anesthesia Type: General Level of consciousness: awake and alert Pain management: pain level controlled Vital Signs Assessment: post-procedure vital signs reviewed and stable Respiratory status: spontaneous breathing, nonlabored ventilation, respiratory function stable and patient connected to nasal cannula oxygen Cardiovascular status: blood pressure returned to baseline and stable Postop Assessment: no apparent nausea or vomiting Anesthetic complications: no    Last Vitals:  Vitals:   03/19/17 1056 03/19/17 1105  BP: 136/70 (!) 134/98  Pulse: 100 100  Resp: 16 16  Temp: 36.8 C   SpO2: 96% 100%    Last Pain:  Vitals:   03/19/17 1105  TempSrc:   PainSc: 0-No pain                 Ryan P Ellender

## 2017-03-19 NOTE — Anesthesia Preprocedure Evaluation (Signed)
Anesthesia Evaluation  Patient identified by MRN, date of birth, ID band Patient awake    Reviewed: Allergy & Precautions, NPO status , Patient's Chart, lab work & pertinent test results  Airway Mallampati: II  TM Distance: >3 FB Neck ROM: Full    Dental no notable dental hx.    Pulmonary former smoker,  Pulmonologist is Lawanda CousinsNestor Jennings, MD. Last office visit 03/06/17 with Kandice RobinsonsSarah Groce, NP who treated pt with zithromax for green sputum and chills but no fever.    Pulmonary exam normal breath sounds clear to auscultation       Cardiovascular negative cardio ROS Normal cardiovascular exam Rhythm:Regular Rate:Normal  ECG: ST, rate 105   Neuro/Psych negative neurological ROS  negative psych ROS   GI/Hepatic negative GI ROS, Neg liver ROS,   Endo/Other  negative endocrine ROS  Renal/GU negative Renal ROS     Musculoskeletal negative musculoskeletal ROS (+)   Abdominal (+) + obese,   Peds  Hematology negative hematology ROS (+)   Anesthesia Other Findings mediastinal adenopathy   Reproductive/Obstetrics                             Anesthesia Physical Anesthesia Plan  ASA: III  Anesthesia Plan: General   Post-op Pain Management:    Induction: Intravenous  PONV Risk Score and Plan: 2 and Ondansetron and Dexamethasone  Airway Management Planned: Oral ETT  Additional Equipment: Arterial line  Intra-op Plan:   Post-operative Plan: Extubation in OR  Informed Consent: I have reviewed the patients History and Physical, chart, labs and discussed the procedure including the risks, benefits and alternatives for the proposed anesthesia with the patient or authorized representative who has indicated his/her understanding and acceptance.   Dental advisory given  Plan Discussed with: CRNA  Anesthesia Plan Comments:         Anesthesia Quick Evaluation

## 2017-03-19 NOTE — Brief Op Note (Signed)
03/19/2017  10:18 AM  PATIENT:  Ronald Walton  26 y.o. male  PRE-OPERATIVE DIAGNOSIS:  mediastinal adenopathy   POST-OPERATIVE DIAGNOSIS:  Non-caseating granulomas c/w Sarcoidosis  PROCEDURE:  Procedure(s): MEDIASTINOSCOPY (N/A)  SURGEON:  Surgeon(s) and Role:    * Loreli SlotHendrickson, Zack Crager C, MD - Primary  PHYSICIAN ASSISTANT:   ASSISTANTS: none   ANESTHESIA:   general  EBL: minimal  BLOOD ADMINISTERED:none  DRAINS: none   LOCAL MEDICATIONS USED:  NONE  SPECIMEN:  Source of Specimen:  Mediastinal nodes  DISPOSITION OF SPECIMEN:  Path and Micro  COUNTS:  YES  TOURNIQUET:  * No tourniquets in log *  DICTATION: .Other Dictation: Dictation Number -  PLAN OF CARE: Discharge to home after PACU  PATIENT DISPOSITION:  PACU - hemodynamically stable.   Delay start of Pharmacological VTE agent (>24hrs) due to surgical blood loss or risk of bleeding: not applicable

## 2017-03-19 NOTE — Interval H&P Note (Signed)
History and Physical Interval Note:  03/19/2017 7:51 AM  Ronald RainwaterLarry Jaquan Walton  has presented today for surgery, with the diagnosis of mediastinal adenopathy   The various methods of treatment have been discussed with the patient and family. After consideration of risks, benefits and other options for treatment, the patient has consented to  Procedure(s): MEDIASTINOSCOPY (N/A) as a surgical intervention .  The patient's history has been reviewed, patient examined, no change in status, stable for surgery.  I have reviewed the patient's chart and labs.  Questions were answered to the patient's satisfaction.     Loreli SlotSteven C Darren Caldron

## 2017-03-19 NOTE — Anesthesia Procedure Notes (Signed)
Procedure Name: Intubation Date/Time: 03/19/2017 8:14 AM Performed by: Shirlyn Goltz Pre-anesthesia Checklist: Patient identified, Emergency Drugs available, Suction available and Patient being monitored Patient Re-evaluated:Patient Re-evaluated prior to induction Oxygen Delivery Method: Circle system utilized Preoxygenation: Pre-oxygenation with 100% oxygen Induction Type: IV induction Ventilation: Mask ventilation without difficulty Laryngoscope Size: Mac and 4 Grade View: Grade I Tube type: Oral Tube size: 8.0 mm Number of attempts: 1 Airway Equipment and Method: Stylet and LTA kit utilized Placement Confirmation: ETT inserted through vocal cords under direct vision,  positive ETCO2 and breath sounds checked- equal and bilateral Secured at: 20 cm Tube secured with: Tape Dental Injury: Teeth and Oropharynx as per pre-operative assessment

## 2017-03-19 NOTE — Progress Notes (Signed)
Report mgiven to SunGardjada rn as caregiver

## 2017-03-20 ENCOUNTER — Encounter (HOSPITAL_COMMUNITY): Payer: Self-pay | Admitting: Thoracic Surgery (Cardiothoracic Vascular Surgery)

## 2017-03-20 DIAGNOSIS — D869 Sarcoidosis, unspecified: Secondary | ICD-10-CM

## 2017-03-20 HISTORY — DX: Sarcoidosis, unspecified: D86.9

## 2017-03-20 LAB — ACID FAST SMEAR (AFB): ACID FAST SMEAR - AFSCU2: NEGATIVE

## 2017-03-20 LAB — ACID FAST SMEAR (AFB, MYCOBACTERIA)

## 2017-03-24 LAB — AEROBIC/ANAEROBIC CULTURE W GRAM STAIN (SURGICAL/DEEP WOUND): Culture: NO GROWTH

## 2017-03-24 LAB — AEROBIC/ANAEROBIC CULTURE (SURGICAL/DEEP WOUND)

## 2017-04-08 ENCOUNTER — Encounter: Payer: Self-pay | Admitting: Thoracic Surgery (Cardiothoracic Vascular Surgery)

## 2017-04-08 ENCOUNTER — Ambulatory Visit: Payer: BLUE CROSS/BLUE SHIELD | Admitting: Thoracic Surgery (Cardiothoracic Vascular Surgery)

## 2017-04-08 ENCOUNTER — Other Ambulatory Visit: Payer: Self-pay

## 2017-04-08 VITALS — BP 113/84 | HR 93 | Ht 71.0 in | Wt 238.0 lb

## 2017-04-08 DIAGNOSIS — D861 Sarcoidosis of lymph nodes: Secondary | ICD-10-CM

## 2017-04-08 NOTE — Progress Notes (Signed)
      [address]       [place] [id number]             [phone]    HPI: Mr. Janee Mornhompson returns for a scheduled postoperative follow-up visit  He is a 26 year old man who was well until this past summer when he developed migrating swelling and pain in his joints.  He then developed a productive cough and general malaise.  He had significant weight loss.  Chest x-ray and CT of the chest showed mediastinal adenopathy.  He was referred to Dr. Jamison NeighborNestor who referred him for mediastinoscopy.  I did mediastinoscopy on 03/19/2017.  Biopsies were consistent with sarcoidosis with noncaseating granulomas.  Cultures have been negative to date.  Started him on 10 mg of prednisone daily to tide him over until he can follow-up with pulmonary.  Past Medical History:  Diagnosis Date  . Low iron   . Sarcoidosis 03/20/2017    Current Outpatient Medications  Medication Sig Dispense Refill  . acetaminophen (TYLENOL) 500 MG tablet Take 1,000 mg by mouth every 8 (eight) hours as needed for mild pain.    Marland Kitchen. azithromycin (ZITHROMAX) 250 MG tablet Take 2 pills today then one a day for 4 additional days 6 tablet 0  . cetirizine (ZYRTEC) 10 MG tablet Take 10 mg by mouth daily as needed for allergies.    Marland Kitchen. ibuprofen (ADVIL,MOTRIN) 200 MG tablet Take 200 mg by mouth every 8 (eight) hours as needed for mild pain.    . Multiple Vitamin (MULTIVITAMIN WITH MINERALS) TABS tablet Take 1 tablet by mouth daily. Takes when he remembers    . oxyCODONE (OXY IR/ROXICODONE) 5 MG immediate release tablet Take 1-2 tablets (5-10 mg total) by mouth every 6 (six) hours as needed for moderate pain or breakthrough pain. 30 tablet 0  . predniSONE (DELTASONE) 10 MG tablet Take 1 tablet (10 mg total) by mouth daily before breakfast. 30 tablet 5   No current facility-administered medications for this visit.     Physical Exam BP 113/84   Pulse 93   Ht 5\' 11"  (1.803 m)   Wt 238 lb (108 kg)   SpO2 99%   BMI 33.4119 kg/m    26 year old man in no acute distress Alert and oriented x3 with no focal deficits Incision healing well Lungs with faint wheezing bilaterally  Diagnostic Tests: FINAL DIAGNOSIS Diagnosis 1. Lymph node, biopsy, Level 4R - GRANULOMATOUS INFLAMMATION. 2. Lymph node, biopsy, 4R #2 - GRANULOMATOUS INFLAMMATION. - SEE COMMENT. Microscopic Comment 2. The histologic features in the two specimens are similar and show essentially non-necrotic granulomatous inflammation. PAS, GMS, and AFB stains performed on part 2 fail to highlight the presence of microorganisms. The leading differential diagnosis includes sarcoidosis. (JBK:ah 03/20/17) Pecola LeisureJOSHUA KISH MD Pathologist, Electronic  Impression: 26 year old with mediastinal adenopathy and migratory arthritis.  Biopsy showed noncaseating granulomatous inflammation consistent with sarcoidosis.  That diagnosis fits with his clinical picture.  From a surgical perspective he is doing well.  His wound is healing nicely and he does not have any narcotics.  He does continue to have joint pain.  There are no restrictions on his activities from a surgical standpoint  He needs a follow-up appointment with pulmonary as soon as possible.  I will have my office call and to try to arrange that.   Loreli SlotSteven C Chayse Gracey, MD [facility] [phone]

## 2017-04-17 ENCOUNTER — Ambulatory Visit: Payer: BLUE CROSS/BLUE SHIELD | Admitting: Pulmonary Disease

## 2017-04-17 LAB — FUNGUS CULTURE WITH STAIN

## 2017-04-17 LAB — FUNGAL ORGANISM REFLEX

## 2017-04-17 LAB — FUNGUS CULTURE RESULT

## 2017-04-29 ENCOUNTER — Ambulatory Visit: Payer: BLUE CROSS/BLUE SHIELD | Admitting: Pulmonary Disease

## 2017-06-03 ENCOUNTER — Encounter: Payer: Self-pay | Admitting: Pulmonary Disease

## 2017-06-04 ENCOUNTER — Ambulatory Visit: Payer: BLUE CROSS/BLUE SHIELD | Admitting: Pulmonary Disease

## 2017-06-06 ENCOUNTER — Ambulatory Visit: Payer: BLUE CROSS/BLUE SHIELD | Admitting: Pulmonary Disease

## 2017-06-12 ENCOUNTER — Other Ambulatory Visit (INDEPENDENT_AMBULATORY_CARE_PROVIDER_SITE_OTHER): Payer: BLUE CROSS/BLUE SHIELD

## 2017-06-12 ENCOUNTER — Encounter: Payer: Self-pay | Admitting: Internal Medicine

## 2017-06-12 ENCOUNTER — Ambulatory Visit (INDEPENDENT_AMBULATORY_CARE_PROVIDER_SITE_OTHER): Payer: BLUE CROSS/BLUE SHIELD | Admitting: Internal Medicine

## 2017-06-12 ENCOUNTER — Ambulatory Visit (INDEPENDENT_AMBULATORY_CARE_PROVIDER_SITE_OTHER)
Admission: RE | Admit: 2017-06-12 | Discharge: 2017-06-12 | Disposition: A | Discharge status: Home / Self Care | Payer: BLUE CROSS/BLUE SHIELD | Source: Ambulatory Visit | Attending: Internal Medicine | Admitting: Internal Medicine

## 2017-06-12 VITALS — BP 128/70 | HR 92 | Ht 72.0 in | Wt 259.0 lb

## 2017-06-12 DIAGNOSIS — D860 Sarcoidosis of lung: Secondary | ICD-10-CM

## 2017-06-12 DIAGNOSIS — R05 Cough: Secondary | ICD-10-CM

## 2017-06-12 DIAGNOSIS — R053 Chronic cough: Secondary | ICD-10-CM

## 2017-06-12 DIAGNOSIS — R059 Cough, unspecified: Secondary | ICD-10-CM

## 2017-06-12 LAB — CBC WITH DIFFERENTIAL/PLATELET
BASOS ABS: 0 10*3/uL (ref 0.0–0.1)
Basophils Relative: 0.7 % (ref 0.0–3.0)
EOS ABS: 0.1 10*3/uL (ref 0.0–0.7)
Eosinophils Relative: 0.8 % (ref 0.0–5.0)
HCT: 45.4 % (ref 39.0–52.0)
Hemoglobin: 14.6 g/dL (ref 13.0–17.0)
LYMPHS ABS: 0.9 10*3/uL (ref 0.7–4.0)
Lymphocytes Relative: 14.2 % (ref 12.0–46.0)
MCHC: 32.1 g/dL (ref 30.0–36.0)
Monocytes Absolute: 0.5 10*3/uL (ref 0.1–1.0)
Monocytes Relative: 6.9 % (ref 3.0–12.0)
NEUTROS ABS: 5.1 10*3/uL (ref 1.4–7.7)
NEUTROS PCT: 77.4 % — AB (ref 43.0–77.0)
PLATELETS: 333 10*3/uL (ref 150.0–400.0)
RBC: 6.71 Mil/uL — ABNORMAL HIGH (ref 4.22–5.81)
RDW: 18 % — ABNORMAL HIGH (ref 11.5–15.5)
WBC: 6.6 10*3/uL (ref 4.0–10.5)

## 2017-06-12 LAB — SEDIMENTATION RATE: Sed Rate: 64 mm/hr — ABNORMAL HIGH (ref 0–15)

## 2017-06-12 MED ORDER — PANTOPRAZOLE SODIUM 40 MG PO TBEC: 40.0000 mg | DELAYED_RELEASE_TABLET | Freq: Every day | ORAL | 2 refills | Status: DC

## 2017-06-12 MED ORDER — PREDNISONE 10 MG PO TABS: ORAL_TABLET | ORAL | 1 refills | Status: DC

## 2017-06-12 MED ORDER — FAMOTIDINE 20 MG PO TABS: ORAL_TABLET | ORAL | 2 refills | Status: DC

## 2017-06-12 NOTE — Progress Notes (Signed)
Subjective:     Patient ID: Ronald Walton, male   DOB: [birth year],    MRN: [id number]  HPI   06/12/2017  tansition of care ext f/u ov/Ortha Metts re: sarcoid management/ chronic cough formerly [employer] Complaint  Patient presents with  . new consult    sarcoidosis, coughing       26 yowbm quit smoking 12/2016 with new onset polyarthritis 10/2016  Then wt loss and cough >  Dx as sarcoid by lung by mediastinioscopy bx 03/19/17 > rx 10 mg daily >>  Wt loss / arthritis gone all that's left is the cough which is worse with temp change and worse at hs but does not keep him up all night assoc with nasal congestion / anosmia/ some better with zyrtec Not limited by breathing from desired activities     No obvious day to day or daytime variability or assoc excess/ purulent sputum or mucus plugs or hemoptysis or cp or chest tightness, subjective wheeze or overt sinus or hb symptoms. No unusual exposure hx or h/o childhood pna/ asthma or knowledge of premature birth.  Sleeping ok flat once asleep without nocturnal  or early am exacerbation  of respiratory  c/o's or need for noct saba. Also denies any obvious fluctuation of symptoms with weather or environmental changes or other aggravating or alleviating factors except as outlined above   Current Allergies, Complete Past Medical History, Past Surgical History, Family History, and Social History were reviewed in [name] record.  ROS  The following are not active complaints unless bolded Hoarseness, sore throat, dysphagia, dental problems, itching, sneezing,  nasal congestion or discharge of excess mucus or purulent secretions, ear ache,   fever, chills, sweats, unintended wt loss or wt gain, classically pleuritic or exertional cp,  orthopnea pnd or leg swelling, presyncope, palpitations, abdominal pain, anorexia, nausea, vomiting, diarrhea  or change in bowel habits or change in bladder habits, change in stools or change in  urine, dysuria, hematuria,  rash, arthralgias, visual complaints, headache, numbness, weakness or ataxia or problems with walking or coordination,  change in mood/affect or memory.        Current Meds  Medication Sig  . acetaminophen (TYLENOL) 500 MG tablet Take 1,000 mg by mouth every 8 (eight) hours as needed for mild pain.  . cetirizine (ZYRTEC) 10 MG tablet Take 10 mg by mouth daily as needed for allergies.  Marland Kitchen ibuprofen (ADVIL,MOTRIN) 200 MG tablet Take 200 mg by mouth every 8 (eight) hours as needed for mild pain.  . Multiple Vitamin (MULTIVITAMIN WITH MINERALS) TABS tablet Take 1 tablet by mouth daily. Takes when he remembers  .     .   predniSONE (DELTASONE) 10 MG tablet Take 1 tablet (10 mg total) by mouth daily before breakfast.           Review of Systems    Objective:   Physical Exam   Amb obese bm mild nasal tone to voice   Wt Readings from Last 3 Encounters:  06/12/17 259 lb (117.5 kg)  04/08/17 238 lb (108 kg)  03/19/17 238 lb (108 kg)     Vital signs reviewed - Note on arrival 02 sats  96% on RA      HEENT: nl dentition,  and oropharynx. Nl external ear canals without cough reflex -  moderate bilateral non-specific turbinate edema     NECK :  without JVD/Nodes/TM/ nl carotid upstrokes bilaterally   LUNGS: no acc muscle use,  Nl  contour chest  With cough on insp    CV:  RRR  no s3 or murmur or increase in P2, and no edema   ABD:  soft and nontender with nl inspiratory excursion in the supine position. No bruits or organomegaly appreciated, bowel sounds nl  MS:  Nl gait/ ext warm without deformities, calf tenderness, cyanosis or clubbing No obvious joint restrictions   SKIN: warm and dry without lesions    NEURO:  alert, approp, nl sensorium with  no motor or cerebellar deficits apparent.      CXR PA and Lateral:   06/12/2017 :    I personally reviewed images and agree with radiology impression as follows:   1. Decreased mediastinal and hilar  lymphadenopathy. 2. Stable to slightly increased interstitial disease in the setting of known sarcoidosis.    Lab Results  Component Value Date   ESRSEDRATE 64 (H) 06/12/2017   ESRSEDRATE 93 (H) 02/14/2017     Labs ordered 06/12/2017  Angiotensin/ allergy profile        Assessment:

## 2017-06-12 NOTE — Patient Instructions (Addendum)
Prednisone 10 mg x 2 with bfast until 100% better then one daily   Pantoprazole (protonix) 40 mg   Take  30-60 min before first meal of the day and Pepcid (famotidine)  20 mg one @  bedtime until return to office - this is the best way to tell whether stomach acid is contributing to your problem.    GERD (REFLUX)  is an extremely common cause of respiratory symptoms just like yours , many times with no obvious heartburn at all.    It can be treated with medication, but also with lifestyle changes including elevation of the head of your bed (ideally with 6 inch  bed blocks),  Smoking cessation, avoidance of late meals, excessive alcohol, and avoid fatty foods, chocolate, peppermint, colas, red wine, and acidic juices such as orange juice.  NO MINT OR MENTHOL PRODUCTS SO NO COUGH DROPS   USE SUGARLESS CANDY INSTEAD (Jolley ranchers or Stover's or Life Savers) or even ice chips will also do - the key is to swallow to prevent all throat clearing. NO OIL BASED VITAMINS - use powdered substitutes.   Please see patient coordinator before you leave today  to schedule sinus ct  Please remember to go to the lab and x-ray department downstairs in the basement  for your tests - we will call you with the results when they are available.    Please schedule a follow up office visit in 4 weeks, sooner if needed with pfts

## 2017-06-13 ENCOUNTER — Encounter: Payer: Self-pay | Admitting: Internal Medicine

## 2017-06-13 LAB — RESPIRATORY ALLERGY PROFILE REGION II ~~LOC~~
Allergen, A. alternata, m6: <0.1 kU/L
Allergen, Cedar tree, t12: <0.1 kU/L
Allergen, Comm Silver Birch, t9: <0.1 kU/L
Allergen, D pternoyssinus,d7: <0.1 kU/L
Allergen, Mouse Urine Protein, e78: <0.1 kU/L
Allergen, Mulberry, t76: <0.1 kU/L
Allergen, P. notatum, m1: <0.1 kU/L
Aspergillus fumigatus, m3: <0.1 kU/L
CLADOSPORIUM HERBARUM (M2) IGE: <0.1 kU/L
CLASS: 0
CLASS: 0
CLASS: 0
CLASS: 0
CLASS: 0
CLASS: 0
CLASS: 0
CLASS: 0
CLASS: 0
CLASS: 0
COMMON RAGWEED (SHORT) (W1) IGE: <0.1 kU/L
Cat Dander: <0.1 kU/L
Class: 0
Class: 0
Class: 0
Class: 0
Class: 0
Class: 0
Class: 0
Class: 0
Class: 0
Class: 0
Class: 0
Class: 0
Class: 0
Class: 0
D. farinae: <0.1 kU/L
IGE (IMMUNOGLOBULIN E), SERUM: 43 kU/L (ref <=114)
Pecan/Hickory Tree IgE: <0.1 kU/L
Rough Pigweed  IgE: <0.1 kU/L
Timothy Grass: <0.1 kU/L

## 2017-06-13 LAB — INTERPRETATION:

## 2017-06-13 LAB — ANGIOTENSIN CONVERTING ENZYME: Angiotensin-Converting Enzyme: 72 U/L — ABNORMAL HIGH (ref 9–67)

## 2017-06-13 NOTE — Assessment & Plan Note (Signed)
mediastinioscopy bx 03/19/17 > pred 10 mg daily improved all symptoms x cough/rhinitis - 06/12/2017 increased pred to 20 mg until better then back to 10 mg daily   The goal with a chronic steroid dependent illness is always arriving at the lowest effective dose that controls the disease/symptoms and not accepting a set "formula" which is based on statistics or guidelines that don't always take into account patient  variability or the natural hx of the dz in every individual patient, which may well vary over time.  For now therefore I recommend the patient maintain  20 mg until better then 10 mg daily   Reviewed diet to offset wt gain from steroids   F/u with pfts int 4-6 weeks   I had an extended discussion with the patient reviewing all relevant studies completed to date and  lasting 25 minutes of a 40  minute extended transition of care office visit with pt new to me     re  severe non-specific but potentially very serious refractory respiratory symptoms of uncertain and potentially multiple  etiologies.   Each maintenance medication was reviewed in detail including most importantly the difference between maintenance and prns and under what circumstances the prns are to be triggered using an action plan format that is not reflected in the computer generated alphabetically organized AVS.    Please see AVS for specific instructions unique to this office visit that I personally wrote and verbalized to the the pt in detail and then reviewed with pt  by my nurse highlighting any changes in therapy/plan of care  recommended at today's visit.

## 2017-06-13 NOTE — Assessment & Plan Note (Addendum)
Allergy profile 06/12/2017 >  Eos 0.1 /  IgE  43 RAST neg - sinus CT ordered   Cough has many features of Upper airway cough syndrome (previously labeled PNDS),  is so named because it's frequently impossible to sort out how much is  CR/sinusitis with freq throat clearing (which can be related to primary GERD)   vs  causing  secondary (" extra esophageal")  GERD from wide swings in gastric pressure that occur with throat clearing, often  promoting self use of mint and menthol lozenges that reduce the lower esophageal sphincter tone and exacerbate the problem further in a cyclical fashion.   These are the same pts (now being labeled as having "irritable larynx syndrome" by some cough centers) who not infrequently have a history of having failed to tolerate ace inhibitors,  dry powder inhalers or biphosphonates or report having atypical/extraesophageal reflux symptoms that don't respond to standard doses of PPI  and are easily confused as having aecopd or asthma flares by even experienced allergists/ pulmonologists (myself included).   rec max rx for gerd and check   sinus ct > if neg add 1st gen H1 blockers per guidelines

## 2017-06-13 NOTE — Progress Notes (Signed)
ATC, NA 

## 2017-06-17 NOTE — Progress Notes (Signed)
ATC, NA and his VM was full so unable to leave msg

## 2017-06-17 NOTE — Progress Notes (Signed)
ATC, NA and his VM was full so unable to leave msg

## 2017-06-19 ENCOUNTER — Encounter: Payer: Self-pay | Admitting: *Deleted

## 2017-06-20 ENCOUNTER — Ambulatory Visit (INDEPENDENT_AMBULATORY_CARE_PROVIDER_SITE_OTHER)
Admission: RE | Admit: 2017-06-20 | Discharge: 2017-06-20 | Disposition: A | Discharge status: Home / Self Care | Payer: BLUE CROSS/BLUE SHIELD | Source: Ambulatory Visit | Attending: Internal Medicine | Admitting: Internal Medicine

## 2017-06-20 ENCOUNTER — Other Ambulatory Visit: Payer: Self-pay | Admitting: Internal Medicine

## 2017-06-20 DIAGNOSIS — R05 Cough: Secondary | ICD-10-CM

## 2017-06-20 DIAGNOSIS — R053 Chronic cough: Secondary | ICD-10-CM

## 2017-06-20 DIAGNOSIS — J320 Chronic maxillary sinusitis: Secondary | ICD-10-CM

## 2017-06-20 DIAGNOSIS — R059 Cough, unspecified: Secondary | ICD-10-CM

## 2017-06-20 DIAGNOSIS — R0981 Nasal congestion: Secondary | ICD-10-CM | POA: Diagnosis not present

## 2017-06-20 MED ORDER — AMOXICILLIN-POT CLAVULANATE 875-125 MG PO TABS: 1.0000 | ORAL_TABLET | Freq: Two times a day (BID) | ORAL | 0 refills | Status: DC

## 2017-06-20 NOTE — Progress Notes (Signed)
Spoke with pt and notified of results per Dr. Wert. Pt verbalized understanding and denied any questions. 

## 2017-07-11 ENCOUNTER — Other Ambulatory Visit: Payer: Self-pay | Admitting: Internal Medicine

## 2017-07-11 ENCOUNTER — Ambulatory Visit (INDEPENDENT_AMBULATORY_CARE_PROVIDER_SITE_OTHER)
Admission: RE | Admit: 2017-07-11 | Discharge: 2017-07-11 | Disposition: A | Discharge status: Home / Self Care | Payer: BLUE CROSS/BLUE SHIELD | Source: Ambulatory Visit | Attending: Internal Medicine | Admitting: Internal Medicine

## 2017-07-11 ENCOUNTER — Encounter: Payer: Self-pay | Admitting: Internal Medicine

## 2017-07-11 ENCOUNTER — Ambulatory Visit (INDEPENDENT_AMBULATORY_CARE_PROVIDER_SITE_OTHER): Payer: BLUE CROSS/BLUE SHIELD | Admitting: Internal Medicine

## 2017-07-11 ENCOUNTER — Ambulatory Visit: Payer: BLUE CROSS/BLUE SHIELD | Admitting: Internal Medicine

## 2017-07-11 VITALS — BP 114/80 | HR 87 | Ht 71.0 in | Wt 269.0 lb

## 2017-07-11 DIAGNOSIS — J329 Chronic sinusitis, unspecified: Secondary | ICD-10-CM | POA: Diagnosis not present

## 2017-07-11 DIAGNOSIS — J320 Chronic maxillary sinusitis: Secondary | ICD-10-CM

## 2017-07-11 DIAGNOSIS — D860 Sarcoidosis of lung: Secondary | ICD-10-CM | POA: Diagnosis not present

## 2017-07-11 DIAGNOSIS — R05 Cough: Secondary | ICD-10-CM | POA: Diagnosis not present

## 2017-07-11 DIAGNOSIS — R053 Chronic cough: Secondary | ICD-10-CM

## 2017-07-11 LAB — PULMONARY FUNCTION TEST
DL/VA % pred: 118 %
DL/VA: 5.61 ml/min/mmHg/L
DLCO COR % PRED: 89 %
DLCO COR: 30.09 ml/min/mmHg
DLCO unc % pred: 89 %
DLCO unc: 30.09 ml/min/mmHg
FEF 25-75 POST: 2.87 L/s
FEF 25-75 Pre: 2.67 L/sec
FEF2575-%Change-Post: 7 %
FEF2575-%PRED-POST: 66 %
FEF2575-%Pred-Pre: 61 %
FEV1-%CHANGE-POST: 1 %
FEV1-%PRED-POST: 78 %
FEV1-%Pred-Pre: 76 %
FEV1-Post: 3.11 L
FEV1-Pre: 3.05 L
FEV1FVC-%CHANGE-POST: 0 %
FEV1FVC-%PRED-PRE: 92 %
FEV6-%Change-Post: 1 %
FEV6-%Pred-Post: 84 %
FEV6-%Pred-Pre: 83 %
FEV6-Post: 3.96 L
FEV6-Pre: 3.91 L
FEV6FVC-%Change-Post: 0 %
FEV6FVC-%Pred-Post: 100 %
FEV6FVC-%Pred-Pre: 101 %
FVC-%CHANGE-POST: 1 %
FVC-%PRED-POST: 83 %
FVC-%PRED-PRE: 82 %
FVC-POST: 3.97 L
FVC-PRE: 3.91 L
POST FEV1/FVC RATIO: 78 %
PRE FEV6/FVC RATIO: 100 %
Post FEV6/FVC ratio: 100 %
Pre FEV1/FVC ratio: 78 %
RV % pred: 106 %
RV: 1.75 L
TLC % pred: 80 %
TLC: 5.72 L

## 2017-07-11 NOTE — Progress Notes (Signed)
Subjective:     Patient ID: Ronald Walton, male   DOB: 06/13/1990,    MRN: [id number]  HPI   06/12/2017  tansition of care ext f/u ov/[name] re: sarcoid management/ chronic cough formerly [employer]ney[employer] Pt Chief Complaint  Patient presents with  . new consult    sarcoidosis, coughing       26 yowbm quit smoking 12/2016 with new onset polyarthritis 10/2016  Then wt loss and cough >  Dx as sarcoid by lung by mediastinioscopy bx 03/19/17 > rx 10 mg daily >>  Wt loss / arthritis gone all that's left is the cough which is worse with temp change and worse at hs but does not keep him up all night assoc with nasal congestion / anosmia/ some better with zyrtec Not limited by breathing from desired activities   rec Prednisone 10 mg x 2 with bfast until 100% better then one daily  Pantoprazole (protonix) 40 mg   Take  30-60 min before first meal of the day and Pepcid (famotidine)  20 mg one @  bedtime until return to office - this is the best way to tell whether stomach acid is contributing to your problem.   GERD  Please see patient coordinator before you leave today  to schedule sinus ct Please remember to go to the lab and x-ray department downstairs in the basement  for your tests - we will call you with the results when they are available. Please schedule a follow up office visit in 4 weeks, sooner if needed with pfts    07/11/2017  f/u ov/[name] re: sarcoid still on 20 mg daiy  Chief Complaint  Patient presents with  . Follow-up    PFT's done today. Breathing has improved and he is coughing less. No new co's today.    Dyspnea:  No limted not working out again yet  Cough:  Still tickle with deep breath/ not hs/ sleeping fine/ can smell better now    arthritis gone  Completely also    No obvious day to day or daytime variability or assoc excess/ purulent sputum or mucus plugs or hemoptysis or cp or chest tightness, subjective wheeze or overt sinus or hb symptoms. No unusual exposure hx or h/o  childhood pna/ asthma or knowledge of premature birth.  Sleeping ok flat without nocturnal  or early am exacerbation  of respiratory  c/o's or need for noct saba. Also denies any obvious fluctuation of symptoms with weather or environmental changes or other aggravating or alleviating factors except as outlined above   Current Allergies, Complete Past Medical History, Past Surgical History, Family History, and Social History were [name].  ROS  The following are not active complaints unless bolded Hoarseness, sore throat, dysphagia, dental problems, itching, sneezing,  nasal congestion or discharge of excess mucus or purulent secretions, ear ache,   fever, chills, sweats, unintended wt loss or wt gain, classically pleuritic or exertional cp,  orthopnea pnd or leg swelling, presyncope, palpitations, abdominal pain, anorexia, nausea, vomiting, diarrhea  or change in bowel habits or change in bladder habits, change in stools or change in urine, dysuria, hematuria,  rash, arthralgias, visual complaints, headache, numbness, weakness or ataxia or problems with walking or coordination,  change in mood/affect or memory.        Current Meds  Medication Sig  . acetaminophen (TYLENOL) 500 MG tablet Take 1,000 mg by mouth every 8 (eight) hours as needed for mild pain.  . cetirizine (  ZYRTEC) 10 MG tablet Take 10 mg by mouth daily as needed for allergies.  . famotidine (PEPCID) 20 MG tablet One at bedtime  . ibuprofen (ADVIL,MOTRIN) 200 MG tablet Take 200 mg by mouth every 8 (eight) hours as needed for mild pain.  . Multiple Vitamin (MULTIVITAMIN WITH MINERALS) TABS tablet Take 1 tablet by mouth daily. Takes when he remembers  . pantoprazole (PROTONIX) 40 MG tablet Take 1 tablet (40 mg total) by mouth daily. Take 30-60 min before first meal of the day  . predniSONE (DELTASONE) 10 MG tablet 2 each am                     Objective:   Physical Exam   amb  obese bm nad   07/11/2017       269   06/12/17 259 lb (117.5 kg)  04/08/17 238 lb (108 kg)  03/19/17 238 lb (108 kg)     Vital signs reviewed - Note on arrival 02 sats  97% on RA     HEENT: nl dentition, turbinates bilaterally, and oropharynx. Nl external ear canals without cough reflex   NECK :  without JVD/Nodes/TM/ nl carotid upstrokes bilaterally   LUNGS: no acc muscle use,  Nl contour chest which is clear to A and P bilaterally without cough on insp or exp maneuvers   CV:  RRR  no s3 or murmur or increase in P2, and no edema   ABD:  soft and nontender with nl inspiratory excursion in the supine position. No bruits or organomegaly appreciated, bowel sounds nl  MS:  Nl gait/ ext warm without deformities, calf tenderness, cyanosis or clubbing No obvious joint restrictions   SKIN: warm and dry without lesions    NEURO:  alert, approp, nl sensorium with  no motor or cerebellar deficits apparent.              Assessment:

## 2017-07-11 NOTE — Assessment & Plan Note (Signed)
Allergy profile 06/12/2017 >  Eos 0.1 /  IgE  43 RAST neg - sinus CT 06/20/2017 >>> Fluid level in the left maxillary sinus compatible with acute sinusitis > Augmentin 875 mg take one pill twice daily  X 20 days   - Repeat sinus ct p rx 07/11/2017  : still small fluid level L max sinus but improved vs pre rx   Says he can now smell and no hs cough or purulent nasal discharge so defer ent for clinical relapse

## 2017-07-11 NOTE — Patient Instructions (Addendum)
Try prednisone 20 mg even days and 10 mg odd days x 2 weeks then 10 mg daily if exactly the same    Please schedule a follow up office visit in 6 weeks, call sooner if needed

## 2017-07-11 NOTE — Assessment & Plan Note (Addendum)
mediastinioscopy bx 03/19/17 > pred 10 mg daily improved all symptoms x cough/rhinitis - 06/12/2017 increased pred to 20 mg until better then back to 10 mg daily  - 06/12/2017  Angiotensin level = 72 on pred 10 mg daily vs 02/14/17 = 72 on none - 06/12/2017  rec prednisone 20 mg day  - PFT's  07/11/2017  FVC  3.91 (82%)  p no % improvement from saba p nothing prior to study with DLCO  89/89c % corrects to 118 % for alv volume   Nothing to suggest an airway component here with cough on inspiration resolved on rx for gerd/ sinusitis and higher doses of prednisone   The goal with a chronic steroid dependent illness is always arriving at the lowest effective dose that controls the disease/symptoms and not accepting a set "formula" which is based on statistics or guidelines that don't always take into account patient  variability or the natural hx of the dz in every individual patient, which may well vary over time.  For now therefore I recommend the patient maintain  Ceiling of 20 and a floor of 10 with taper to 20/10 first   Discussed in detail all the  indications, usual  risks and alternatives  relative to the benefits with patient who agrees to proceed with rx as outlined and emphasized side effects likely to encounter are most importantly related to diet   I had an extended discussion with the patient reviewing all relevant studies completed to date and  lasting 15 to 20 minutes of a 25 minute visit    Each maintenance medication was reviewed in detail including most importantly the difference between maintenance and prns and under what circumstances the prns are to be triggered using an action plan format that is not reflected in the computer generated alphabetically organized AVS.    Please see AVS for specific instructions unique to this visit that I personally wrote and verbalized to the the pt in detail and then reviewed with pt  by my nurse highlighting any  changes in therapy recommended at  today's visit to their plan of care.

## 2017-07-11 NOTE — Progress Notes (Signed)
PFT completed today.  

## 2017-07-14 ENCOUNTER — Other Ambulatory Visit: Payer: BLUE CROSS/BLUE SHIELD

## 2017-08-22 ENCOUNTER — Encounter: Payer: Self-pay | Admitting: Internal Medicine

## 2017-08-22 ENCOUNTER — Ambulatory Visit: Payer: BLUE CROSS/BLUE SHIELD | Admitting: Internal Medicine

## 2017-08-22 ENCOUNTER — Other Ambulatory Visit (INDEPENDENT_AMBULATORY_CARE_PROVIDER_SITE_OTHER): Payer: BLUE CROSS/BLUE SHIELD

## 2017-08-22 VITALS — BP 118/74 | HR 89 | Ht 71.0 in | Wt 247.0 lb

## 2017-08-22 DIAGNOSIS — D860 Sarcoidosis of lung: Secondary | ICD-10-CM | POA: Diagnosis not present

## 2017-08-22 LAB — SEDIMENTATION RATE: SED RATE: 16 mm/h — AB (ref 0–15)

## 2017-08-22 NOTE — Patient Instructions (Signed)
Stay on 10 mg per day through April and then start one half daily in May    Please remember to go to the lab department downstairs in the basement  for your tests - we will call you with the results when they are available.      Please schedule a follow up office visit in 6 weeks, call sooner if needed

## 2017-08-22 NOTE — Progress Notes (Signed)
Spoke with pt and notified of results per Dr. Wert. Pt verbalized understanding and denied any questions. 

## 2017-08-22 NOTE — Progress Notes (Signed)
Subjective:     Patient ID: Ronald Walton, male   DOB: 07-21-1990,    MRN: [id number]   Brief patient profile:  26 yobm active smoker originally presenting with cough and polyarthritis. with dx of sarcoid 03/19/17 rx pred started at that point     History of Present Illness   06/12/2017  tansition of care ext f/u ov/Ronald Walton re: sarcoid management/ chronic cough formerly [employer] Complaint  Patient presents with  . new consult    sarcoidosis, coughing    26 yowbm quit smoking 12/2016 with new onset polyarthritis 10/2016  Then wt loss and cough >  Dx as sarcoid by lung by mediastinioscopy bx 03/19/17 > rx 10 mg daily >>  Wt loss / arthritis gone all that's left is the cough which is worse with temp change and worse at hs but does not keep him up all night assoc with nasal congestion / anosmia/ some better with zyrtec Not limited by breathing from desired activities   rec Prednisone 10 mg x 2 with bfast until 100% better then one daily  Pantoprazole (protonix) 40 mg   Take  30-60 min before first meal of the day and Pepcid (famotidine)  20 mg one @  bedtime until return to office - this is the best way to tell whether stomach acid is contributing to your problem.   GERD  Please see patient coordinator before you leave today  to schedule sinus ct Please remember to go to the lab and x-ray department downstairs in the basement  for your tests - we will call you with the results when they are available. Please schedule a follow up office visit in 4 weeks, sooner if needed with pfts    07/11/2017  f/u ov/Ronald Walton re: sarcoid still on 20 mg daiy  Chief Complaint  Patient presents with  . Follow-up    PFT's done today. Breathing has improved and he is coughing less. No new co's today.    Dyspnea:  No limted not working out again yet  Cough:  Still tickle with deep breath/ not hs/ sleeping fine/ can smell better now    arthritis gone  Completely also  rec Try prednisone 20 mg even days  and 10 mg odd days x 2 weeks then 10 mg daily if exactly the same     08/22/2017  f/u ov/Ronald Walton re:  Cough and arthritis ankles and feet resolved and stayed gone on taper  to 10 mg daily  Chief Complaint  Patient presents with  . Follow-up    Cough has resolved.  Dyspnea:  Not limited by breathing from desired activities  Including multiple flights of steps Cough: .none Sleep: no problem SABA use:  None   No obvious day to day or daytime variability or assoc excess/ purulent sputum or mucus plugs or hemoptysis or cp or chest tightness, subjective wheeze or overt sinus or hb symptoms. No unusual exposure hx or h/o childhood pna/ asthma or knowledge of premature birth.  Sleeping ok flat without nocturnal  or early am exacerbation  of respiratory  c/o's or need for noct saba. Also denies any obvious fluctuation of symptoms with weather or environmental changes or other aggravating or alleviating factors except as outlined above   Current Allergies, Complete Past Medical History, Past Surgical History, Family History, and Social History were reviewed in [name] record.  ROS  The following are not active complaints unless bolded Hoarseness, sore throat, dysphagia, dental problems, itching, sneezing,  nasal congestion or discharge of excess mucus or purulent secretions, ear ache,   fever, chills, sweats, unintended wt loss or wt gain, classically pleuritic or exertional cp,  orthopnea pnd or leg swelling, presyncope, palpitations, abdominal pain, anorexia, nausea, vomiting, diarrhea  or change in bowel habits or change in bladder habits, change in stools or change in urine, dysuria, hematuria,  rash, arthralgias, visual complaints, headache, numbness, weakness or ataxia or problems with walking or coordination,  change in mood/affect or memory.        Current Meds  Medication Sig  . acetaminophen (TYLENOL) 500 MG tablet Take 1,000 mg by mouth every 8 (eight) hours as  needed for mild pain.  . famotidine (PEPCID) 20 MG tablet One at bedtime  . ibuprofen (ADVIL,MOTRIN) 200 MG tablet Take 200 mg by mouth every 8 (eight) hours as needed for mild pain.  . Multiple Vitamin (MULTIVITAMIN WITH MINERALS) TABS tablet Take 1 tablet by mouth daily. Takes when he remembers  . pantoprazole (PROTONIX) 40 MG tablet Take 1 tablet (40 mg total) by mouth daily. Take 30-60 min before first meal of the day  . predniSONE (DELTASONE) 10 MG tablet 2 each am (Patient taking differently: 10 mg. 2 each am)                        Objective:   Physical Exam   Pleasant am obese bm nad   08/22/2017         247  07/11/2017       269   06/12/17 259 lb (117.5 kg)  04/08/17 238 lb (108 kg)  03/19/17 238 lb (108 kg)     Vital signs reviewed - Note on arrival 02 sats  100% on RA      HEENT: nl dentition, turbinates bilaterally, and oropharynx. Nl external ear canals without cough reflex   NECK :  without JVD/Nodes/TM/ nl carotid upstrokes bilaterally   LUNGS: no acc muscle use,  Nl contour chest which is clear to A and P bilaterally without cough on insp or exp maneuvers   CV:  RRR  no s3 or murmur or increase in P2, and no edema   ABD:  soft and nontender with nl inspiratory excursion in the supine position. No bruits or organomegaly appreciated, bowel sounds nl  MS:  Nl gait/ ext warm without deformities, calf tenderness, cyanosis or clubbing No obvious joint restrictions   SKIN: warm and dry without lesions    NEURO:  alert, approp, nl sensorium with  no motor or cerebellar deficits apparent.        Lab Results  Component Value Date   ESRSEDRATE 16 (H) 08/22/2017   ESRSEDRATE 64 (H) 06/12/2017   ESRSEDRATE 93 (H) 02/14/2017         Assessment:

## 2017-08-24 ENCOUNTER — Encounter: Payer: Self-pay | Admitting: Internal Medicine

## 2017-08-24 NOTE — Assessment & Plan Note (Signed)
mediastinioscopy bx 03/19/17 > pred 10 mg daily improved all symptoms x cough/rhinitis - 06/12/2017 increased pred to 20 mg until better then back to 10 mg daily  - 06/12/2017  Angiotensin level = 72 on pred 10 mg daily vs 02/14/17 = 72 on none - 06/12/2017  rec prednisone 20 mg day > started taper 07/11/17 - PFT's  07/11/2017  FVC  3.91 (82%)  p no % improvement from saba p nothing prior to study with DLCO  89/89c % corrects to 118 % for alv volume   - 08/22/2017  Esr =   @ 10 mg daily  So rec taper to 5 mg daily on Sep 17 2017   Doing great clinically on 10 mg per day and still seeking the "lowest effective dose" > should be able to taper as above and f/u here in 6 weeks    I had an extended discussion with the patient reviewing all relevant studies completed to date and  lasting 15 to 20 minutes of a 25 minute visit    Each maintenance medication was reviewed in detail including most importantly the difference between maintenance and prns and under what circumstances the prns are to be triggered using an action plan format that is not reflected in the computer generated alphabetically organized AVS.    Please see AVS for specific instructions unique to this visit that I personally wrote and verbalized to the the pt in detail and then reviewed with pt  by my nurse highlighting any  changes in therapy recommended at today's visit to their plan of care.

## 2017-09-08 ENCOUNTER — Other Ambulatory Visit: Payer: Self-pay | Admitting: Internal Medicine

## 2017-09-08 MED ORDER — PANTOPRAZOLE SODIUM 40 MG PO TBEC: 40.0000 mg | DELAYED_RELEASE_TABLET | Freq: Every day | ORAL | 2 refills | Status: DC

## 2017-09-20 ENCOUNTER — Other Ambulatory Visit: Payer: Self-pay | Admitting: Internal Medicine

## 2017-10-03 ENCOUNTER — Other Ambulatory Visit (INDEPENDENT_AMBULATORY_CARE_PROVIDER_SITE_OTHER): Payer: BLUE CROSS/BLUE SHIELD

## 2017-10-03 ENCOUNTER — Encounter: Payer: Self-pay | Admitting: Internal Medicine

## 2017-10-03 ENCOUNTER — Ambulatory Visit: Payer: BLUE CROSS/BLUE SHIELD | Admitting: Internal Medicine

## 2017-10-03 ENCOUNTER — Telehealth: Payer: Self-pay | Admitting: Internal Medicine

## 2017-10-03 VITALS — BP 128/82 | HR 102 | Ht 71.0 in | Wt 280.0 lb

## 2017-10-03 DIAGNOSIS — R05 Cough: Secondary | ICD-10-CM

## 2017-10-03 DIAGNOSIS — D860 Sarcoidosis of lung: Secondary | ICD-10-CM

## 2017-10-03 DIAGNOSIS — R053 Chronic cough: Secondary | ICD-10-CM

## 2017-10-03 LAB — BASIC METABOLIC PANEL
BUN: 15 mg/dL (ref 6–23)
CHLORIDE: 105 meq/L (ref 96–112)
CO2: 24 meq/L (ref 19–32)
CREATININE: 0.8 mg/dL (ref 0.40–1.50)
Calcium: 9.4 mg/dL (ref 8.4–10.5)
GFR: 147.47 mL/min (ref >60.00)
Glucose, Bld: 90 mg/dL (ref 70–99)
Potassium: 3.9 mEq/L (ref 3.5–5.1)
Sodium: 138 mEq/L (ref 135–145)

## 2017-10-03 LAB — SEDIMENTATION RATE: Sed Rate: 35 mm/hr — ABNORMAL HIGH (ref 0–15)

## 2017-10-03 NOTE — Telephone Encounter (Signed)
Pt states that the bottle says prednisone is 5 mg tablet - advised to return with this bottle at next ov

## 2017-10-03 NOTE — Patient Instructions (Addendum)
Call me back with the strength of the pill on your prednisone bottle (I believe it's 10 mg ) but no change in dose for now = one daily    Please remember to go to the lab department downstairs in the basement  for your tests - we will call you with the results when they are available.     Please schedule a follow up visit in 3 months but call sooner if needed  - needs cxr on return

## 2017-10-03 NOTE — Telephone Encounter (Signed)
Called and spoke with patient, he states that he was supposed to call back and clarify with MW the dosage of prednisone he is taking. The bottle says for him to take 1 tablet twice daily. He is only taking one tablet each morning instead. So he is taking 5 mg daily.    MW please advise, thank you.

## 2017-10-03 NOTE — Progress Notes (Addendum)
Subjective:     Patient ID: Ronald Walton, male   DOB: 1990/05/28,    MRN: [id number]   Brief patient profile:  26 yobm active smoker originally presenting with cough and polyarthritis. 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 new consult    sarcoidosis, coughing    26 yowbm quit smoking 12/2016 with new onset polyarthritis 10/2016  Then wt loss and cough >  Dx as sarcoid by lung by mediastinioscopy bx 03/19/17 > rx 10 mg daily >>  Wt loss / arthritis gone all that's left is the cough which is worse with temp change and worse at hs but does not keep him up all night assoc with nasal congestion / anosmia/ some better with zyrtec Not limited by breathing from desired activities   rec Prednisone 10 mg x 2 with bfast until 100% better then one daily  Pantoprazole (protonix) 40 mg   Take  30-60 min before first meal of the day and Pepcid (famotidine)  20 mg one @  bedtime until return to office - this is the best way to tell whether stomach acid is contributing to your problem.   GERD  Please see patient coordinator before you leave today  to schedule sinus ct Please remember to go to the lab and x-ray department downstairs in the basement  for your tests - we will call you with the results when they are available. Please schedule a follow up office visit in 4 weeks, sooner if needed with pfts    07/11/2017  f/u ov/Ronald Walton re: sarcoid still on 20 mg daiy  Chief Complaint  Patient presents with  . Follow-up    PFT's done today. Breathing has improved and he is coughing less. No new co's today.    Dyspnea:  No limted not working out again yet  Cough:  Still tickle with deep breath/ not hs/ sleeping fine/ can smell better now    arthritis gone  Completely also  rec Try prednisone 20 mg even days  and 10 mg odd days x 2 weeks then 10 mg daily if exactly the same     08/22/2017  f/u ov/Ronald Walton re:  Cough and arthritis ankles and feet resolved and stayed gone on taper  to 10 mg daily  Chief Complaint  Patient presents with  . Follow-up    Cough has resolved.  Dyspnea:  Not limited by breathing from desired activities  Including multiple flights of steps Cough: .none Sleep: no problem SABA use:  None rec Stay on 10 mg per day through April and then start one half daily in May  Please remember to go to the lab department downstairs in the basement  for your tests - we will call you with the results when they are available   10/03/2017  f/u ov/Ronald Walton re: sarcoid on "one pill a day" pred ? Strength (only received rx for 10 mg tabs from this office) Chief Complaint  Patient presents with  . Follow-up    Breathing is doing well. He has occ cough with clear sputum.    not clear how much prednisone he's taking but reduced from 2 pills to one 1st of may 2019 and staring to not some cough and some aching    Not limited by breathing from  desired activities   Min dry daytime cough like a throat tickle/ none noct - had resolved on pred higher dose Min knee aches since decreased prednisone   No obvious day to day or daytime variability or assoc excess/ purulent sputum or mucus plugs or hemoptysis or cp or chest tightness, subjective wheeze or overt sinus or hb symptoms. No unusual exposure hx or h/o childhood pna/ asthma or knowledge of premature birth.  Sleeping ok flat   without nocturnal  or early am exacerbation  of respiratory  c/o's or need for noct saba. Also denies any obvious fluctuation of symptoms with weather or environmental changes or other aggravating or alleviating factors except as outlined above   Current Allergies, Complete Past Medical History, Past Surgical History, Family History, and Social History were reviewed in [name] record.  ROS  The  following are not active complaints unless bolded Hoarseness, sore throat, dysphagia, dental problems, itching, sneezing,  nasal congestion or discharge of excess mucus or purulent secretions, ear ache,   fever, chills, sweats, unintended wt loss or wt gain, classically pleuritic or exertional cp,  orthopnea pnd or arm/hand swelling  or leg swelling, presyncope, palpitations, abdominal pain, anorexia, nausea, vomiting, diarrhea  or change in bowel habits or change in bladder habits, change in stools or change in urine, dysuria, hematuria,  rash, arthralgias mostly knees s stiffness or ankle complaints, visual complaints, headache, numbness, weakness or ataxia or problems with walking or coordination,  change in mood or  memory.        Current Meds  Medication Sig  . acetaminophen (TYLENOL) 500 MG tablet Take 1,000 mg by mouth every 8 (eight) hours as needed for mild pain.  . famotidine (PEPCID) 20 MG tablet TAKE 1 TABLET BY MOUTH EVERYDAY AT BEDTIME  . ibuprofen (ADVIL,MOTRIN) 200 MG tablet Take 200 mg by mouth every 8 (eight) hours as needed for mild pain.  . Multiple Vitamin (MULTIVITAMIN WITH MINERALS) TABS tablet Take 1 tablet by mouth daily. Takes when he remembers  . pantoprazole (PROTONIX) 40 MG tablet Take 1 tablet (40 mg total) by mouth daily. Take 30-60 min before first meal of the day  .     . [  predniSONE (DELTASONE) 10 MG tablet Take one by mouth daily with breakfast.               Objective:   Physical Exam   amb obese bm nad    10/03/2017       280  07/11/2017       269   06/12/17 259 lb (117.5 kg)  04/08/17 238 lb (108 kg)  03/19/17 238 lb (108 kg)      Vital signs reviewed - Note on arrival 02 sats  98% on RA     HEENT: nl dentition, turbinates bilaterally, and oropharynx. Nl external ear canals without cough reflex   NECK :  without JVD/Nodes/TM/ nl carotid upstrokes bilaterally   LUNGS: no acc muscle use,  Nl contour chest which is clear to A and P  bilaterally without cough on insp or exp maneuvers   CV:  RRR  no s3 or murmur or increase in P2, and no edema   ABD:  Obese/ soft and nontender with nl inspiratory excursion in the supine position. No bruits or organomegaly appreciated, bowel sounds nl  MS:  Nl gait/ ext warm without deformities, calf tenderness, cyanosis or clubbing No obvious joint restrictions   SKIN: warm and dry without lesions  NEURO:  alert, approp, nl sensorium with  no motor or cerebellar deficits apparent.        Lab Results  Component Value Date   ESRSEDRATE 35 (H) 10/03/2017   ESRSEDRATE 16 (H) 08/22/2017   ESRSEDRATE 64 (H) 06/12/2017      Labs ordered/ reviewed:      Chemistry      Component Value Date/Time   NA 138 10/03/2017 0924   K 3.9 10/03/2017 0924   CL 105 10/03/2017 0924   CO2 24 10/03/2017 0924   BUN 15 10/03/2017 0924   CREATININE 0.80 10/03/2017 0924      Component Value Date/Time   CALCIUM 9.4 10/03/2017 0924   ALKPHOS 134 (H) 03/14/2017 1045   AST 41 03/14/2017 1045   ALT 47 03/14/2017 1045   BILITOT 0.4 03/14/2017 1045               Assessment:

## 2017-10-03 NOTE — Progress Notes (Signed)
Spoke with pt and notified of results per Dr. Wert. Pt verbalized understanding and denied any questions. 

## 2017-10-04 ENCOUNTER — Encounter: Payer: Self-pay | Admitting: Internal Medicine

## 2017-10-04 NOTE — Assessment & Plan Note (Signed)
mediastinioscopy bx 03/19/17 > pred 10 mg daily improved all symptoms x cough/rhinitis - 06/12/2017 increased pred to 20 mg until better then back to 10 mg daily  - 06/12/2017  Angiotensin level = 72 on pred 10 mg daily vs 02/14/17 = 72 on none - 06/12/2017  rec prednisone 20 mg day > started taper 07/11/17 - PFT's  07/11/2017  FVC  3.91 (82%)  p no % improvement from saba p nothing prior to study with DLCO  89/89c % corrects to 118 % for alv volume  - 08/22/2017  Esr = 16   @ ?  10 mg daily  So rec reduce by half May 2019 but turns out probably was on 20 mg on 08/22/17 as misunderstood instructions   -  10/03/2017  ESR =  35    @ 10 mg dialy And some cough/ aches in knees > no change rx    The goal with a chronic steroid dependent illness is always arriving at the lowest effective dose that controls the disease/symptoms and not accepting a set "formula" which is based on statistics or guidelines that don't always take into account patient  variability or the natural hx of the dz in every individual patient, which may well vary over time.  For now therefore I recommend the patient maintain  10 mg floor and 20 mg ceiling if flare of cough or arthritis   I had an extended discussion with the patient reviewing all relevant studies completed to date and  lasting 15 to 20 minutes of a 25 minute visit    Each maintenance medication was reviewed in detail including most importantly the difference between maintenance and prns and under what circumstances the prns are to be triggered using an action plan format that is not reflected in the computer generated alphabetically organized AVS.    Please see AVS for specific instructions unique to this visit that I personally wrote and verbalized to the the pt in detail and then reviewed with pt  by my nurse highlighting any  changes in therapy recommended at today's visit to their plan of care.

## 2017-10-04 NOTE — Assessment & Plan Note (Signed)
Allergy profile 06/12/2017 >  Eos 0.1 /  IgE  43 RAST neg - sinus CT 06/20/2017 >>> Fluid level in the left maxillary sinus compatible with acute sinusitis > Augmentin 875 mg take one pill twice daily  X 20 days   - Repeat sinus ct p rx 07/11/2017  : still small fluid level L max sinus but improved vs pre rx   Hard to tease out cough from pnds vs cough from sarcoid but could be related as sarcoid can cause rhinitis (though would not have expected this to improve on augmentin, the rhinitis from sarcoid complicated by bacterial sinusitis can def occur)  but for now rec no change rx/ low threshold to refer to ENT to sort out if symptoms become difficult to control

## 2017-12-12 DIAGNOSIS — N448 Other noninflammatory disorders of the testis: Secondary | ICD-10-CM | POA: Diagnosis not present

## 2018-01-08 ENCOUNTER — Ambulatory Visit (INDEPENDENT_AMBULATORY_CARE_PROVIDER_SITE_OTHER): Payer: BLUE CROSS/BLUE SHIELD | Admitting: Internal Medicine

## 2018-01-08 ENCOUNTER — Ambulatory Visit (INDEPENDENT_AMBULATORY_CARE_PROVIDER_SITE_OTHER)
Admission: RE | Admit: 2018-01-08 | Discharge: 2018-01-08 | Disposition: A | Discharge status: Home / Self Care | Payer: BLUE CROSS/BLUE SHIELD | Source: Ambulatory Visit | Attending: Internal Medicine | Admitting: Internal Medicine

## 2018-01-08 ENCOUNTER — Encounter: Payer: Self-pay | Admitting: Internal Medicine

## 2018-01-08 VITALS — BP 136/84 | HR 97 | Ht 72.0 in | Wt 295.0 lb

## 2018-01-08 DIAGNOSIS — R053 Chronic cough: Secondary | ICD-10-CM

## 2018-01-08 DIAGNOSIS — D860 Sarcoidosis of lung: Secondary | ICD-10-CM

## 2018-01-08 DIAGNOSIS — R05 Cough: Secondary | ICD-10-CM

## 2018-01-08 DIAGNOSIS — R0989 Other specified symptoms and signs involving the circulatory and respiratory systems: Secondary | ICD-10-CM | POA: Diagnosis not present

## 2018-01-08 NOTE — Progress Notes (Signed)
Subjective:     Patient ID: Ronald Walton, male   DOB: 11/03/1990,    MRN: [id number]   Brief patient profile:  26 yobm active smoker originally presenting with cough and polyarthritis. 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 new consult    sarcoidosis, coughing    26 yowbm quit smoking 12/2016 with new onset polyarthritis 10/2016  Then wt loss and cough >  Dx as sarcoid by lung by mediastinioscopy bx 03/19/17 > rx 10 mg daily >>  Wt loss / arthritis gone all that's left is the cough which is worse with temp change and worse at hs but does not keep him up all night assoc with nasal congestion / anosmia/ some better with zyrtec Not limited by breathing from desired activities   rec Prednisone 10 mg x 2 with bfast until 100% better then one daily  Pantoprazole (protonix) 40 mg   Take  30-60 min before first meal of the day and Pepcid (famotidine)  20 mg one @  bedtime until return to office - this is the best way to tell whether stomach acid is contributing to your problem.   GERD diet  W/u  Allergy profile 06/12/2017 >  Eos 0.1 /  IgE  43 RAST neg - sinus CT 06/20/2017 >>> Fluid level in the left maxillary sinus compatible with acute sinusitis > Augmentin 875 mg take one pill twice daily  X 20 days   - Repeat sinus ct p rx 07/11/2017  : still small fluid level L max sinus but improved vs pre rx      07/11/2017  f/u ov/Ronald Walton re: sarcoid still on 20 mg daiy  Chief Complaint  Patient presents with  . Follow-up    PFT's done today. Breathing has improved and he is coughing less. No new co's today.    Dyspnea:  No limted not working out again yet  Cough:  Still tickle with deep breath/ not hs/ sleeping fine/ can smell better now    arthritis gone  Completely also  rec Try prednisone 20 mg even  days and 10 mg odd days x 2 weeks then 10 mg daily if exactly the same     08/22/2017  f/u ov/Ronald Walton re:  Cough and arthritis ankles and feet resolved and stayed gone on taper  to 10 mg daily  Chief Complaint  Patient presents with  . Follow-up    Cough has resolved.  Dyspnea:  Not limited by breathing from desired activities  Including multiple flights of steps Cough: .none Sleep: no problem SABA use:  None rec Stay on 10 mg per day through April and then start one half daily in May  Please remember to go to the lab department downstairs in the basement  for your tests - we will call you with the results when they are available   10/03/2017  f/u ov/Ronald Walton re: sarcoid on "one pill a day" pred ? Strength (only received rx for 10 mg tabs from this office) Chief Complaint  Patient presents with  . Follow-up    Breathing is doing well. He has occ cough with clear sputum.    not clear how much prednisone he's taking but reduced from 2 pills to one 1st of may 2019 and staring to not some cough  and some aching   Not limited by breathing from desired activities   Min dry daytime cough like a throat tickle/ none noct - had resolved on pred higher dose Min knee aches since decreased prednisone rec No change rx (turns out was prednisone - 5 mg one tablet daily     01/08/2018  f/u ov/Ronald Walton re: sarcoidosis presently acutely with arthritis, no flare off pred 5 mg daily x one month  Chief Complaint  Patient presents with  . Follow-up    pt doing well, no current breathing complaints.  pt has not taken prednisone X63month.   Dyspnea:  Only after 3 flights and does not need to stop Cough: none Sleeping: fine one pillow/ flat bed  SABA use: none 02: none     No obvious day to day or daytime variability or assoc excess/ purulent sputum or mucus plugs or hemoptysis or cp or chest tightness, subjective wheeze or overt sinus or hb symptoms.   Sleeping as above  without nocturnal  or early am  exacerbation  of respiratory  c/o's or need for noct saba. Also denies any obvious fluctuation of symptoms with weather or environmental changes or other aggravating or alleviating factors except as outlined above   No unusual exposure hx or h/o childhood pna/ asthma or knowledge of premature birth.  Current Allergies, Complete Past Medical History, Past Surgical History, Family History, and Social History were reviewed in [name] record.  ROS  The following are not active complaints unless bolded Hoarseness, sore throat, dysphagia, dental problems, itching, sneezing,  nasal congestion or discharge of excess mucus or purulent secretions, ear ache,   fever, chills, sweats, unintended wt loss or wt gain, classically pleuritic or exertional cp,  orthopnea pnd or arm/hand swelling  or leg swelling, presyncope, palpitations, abdominal pain, anorexia, nausea, vomiting, diarrhea  or change in bowel habits or change in bladder habits, change in stools or change in urine, dysuria, hematuria,  rash, arthralgias, visual complaints, headache, numbness, weakness or ataxia or problems with walking or coordination,  change in mood or  memory.        Current Meds  Medication Sig  . acetaminophen (TYLENOL) 500 MG tablet Take 1,000 mg by mouth every 8 (eight) hours as needed for mild pain.  . famotidine (PEPCID) 20 MG tablet TAKE 1 TABLET BY MOUTH EVERYDAY AT BEDTIME  . ibuprofen (ADVIL,MOTRIN) 200 MG tablet Take 200 mg by mouth every 8 (eight) hours as needed for mild pain.  . Multiple Vitamin (MULTIVITAMIN WITH MINERALS) TABS tablet Take 1 tablet by mouth daily. Takes when he remembers  . pantoprazole (PROTONIX) 40 MG tablet Take 1 tablet (40 mg total) by mouth daily. Take 30-60 min before first meal of the day                    Objective:   Physical Exam   Obese bm nad   01/08/2018       295  10/03/2017       280  07/11/2017       269   06/12/17 259 lb (117.5 kg)   04/08/17 238 lb (108 kg)  03/19/17 238 lb (108 kg)      Vital signs reviewed - Note on arrival 02 sats  98% on RA        HEENT: nl dentition, turbinates bilaterally, and oropharynx. Nl external ear canals without cough reflex   NECK :  without JVD/Nodes/TM/ nl carotid upstrokes bilaterally  LUNGS: no acc muscle use,  Nl contour chest which is clear to A and P bilaterally without cough on insp or exp maneuvers   CV:  RRR  no s3 or murmur or increase in P2, and no edema   ABD:  Obese soft and nontender with nl inspiratory excursion in the supine position. No bruits or organomegaly appreciated, bowel sounds nl  MS:  Nl gait/ ext warm without deformities, calf tenderness, cyanosis or clubbing No obvious joint restrictions   SKIN: warm and dry without lesions    NEURO:  alert, approp, nl sensorium with  no motor or cerebellar deficits apparent.      CXR PA and Lateral:   01/08/2018 :    I personally reviewed images and agree with radiology impression as follows:    Baseline findings of sarcoid. Hilar prominence and perihilar pulmonary scarring without evidence of active inflammation or collapse.        Assessment:

## 2018-01-08 NOTE — Assessment & Plan Note (Signed)
Allergy profile 06/12/2017 >  Eos 0.1 /  IgE  43 RAST neg - sinus CT 06/20/2017 >>> Fluid level in the left maxillary sinus compatible with acute sinusitis > Augmentin 875 mg take one pill twice daily  X 20 days   - Repeat sinus ct p rx 07/11/2017  : still small fluid level L max sinus but improved vs pre rx    Better while on pred ? May have underlying sarcoid related rhinitis/ sinusitis vs gerd related to obesity  For now try restart gerd rx x one month then either needs repeat ct sinus or  ent eval if not improved

## 2018-01-08 NOTE — Assessment & Plan Note (Addendum)
mediastinioscopy bx 03/19/17 > pred 10 mg daily improved all symptoms x cough/rhinitis - 06/12/2017 increased pred to 20 mg until better then back to 10 mg daily  - 06/12/2017  Angiotensin level = 72 on pred 10 mg daily vs 02/14/17 = 72 on none - 06/12/2017  rec prednisone 20 mg day > started taper 07/11/17 - PFT's  07/11/2017  FVC  3.91 (82%)  p no % improvement from saba p nothing prior to study with DLCO  89/89c % corrects to 118 % for alv volume  - 08/22/2017  Esr = 16   @ ?  10 mg daily  So rec reduce by half May 2019 but turns out probably was on 20 mg on 08/22/17 as misunderstood instructions  -  10/03/2017  ESR =  35    @ 10 mg dialy And some cough/ aches in knees > no change rx    - 12/08/17 d/c'd pred on his own  s flare of cough/ arthritis or adrenal insuff > ok to leave off    If cough/ arthritis recur ok to resume prednisone.  The goal with a chronic steroid dependent illness is always arriving at the lowest effective dose that controls the disease/symptoms and not accepting a set "formula" which is based on statistics or guidelines that don't always take into account patient  variability or the natural hx of the dz in every individual patient, which may well vary over time.  For now therefore I recommend the patient maintain  Ceiling of 10 mg and floor of 5 mg Ronald Walton

## 2018-01-08 NOTE — Patient Instructions (Addendum)
If your symptoms happen again, prednisone dose should be 10 mg daily until better then 5 mg daily thereafter   For cough >  Try Pantoprazole (protonix) 40 mg   Take  30-60 min before first meal of the day and Pepcid (famotidine)  20 mg one @  bedtime until return to office - this is the best way to tell whether stomach acid is contributing to your problem.    GERD (REFLUX)  is an extremely common cause of respiratory symptoms just like yours , many times with no obvious heartburn at all.    It can be treated with medication, but also with lifestyle changes including elevation of the head of your bed (ideally with 6 inch  bed blocks),  Smoking cessation, avoidance of late meals, excessive alcohol, and avoid fatty foods, chocolate, peppermint, colas, red wine, and acidic juices such as orange juice.  NO MINT OR MENTHOL PRODUCTS SO NO COUGH DROPS   USE SUGARLESS CANDY INSTEAD (Jolley ranchers or Stover's or Life Savers) or even ice chips will also do - the key is to swallow to prevent all throat clearing. NO OIL BASED VITAMINS - use powdered substitutes.    Please remember to go to the  x-ray department downstairs in the basement  for your tests - we will call you with the results when they are available.      Please schedule a follow up visit in 6  months but call sooner if needed  Add:  Repeat sinus CT or do ent refer if cough not better on gerd rx

## 2018-01-09 ENCOUNTER — Encounter: Payer: Self-pay | Admitting: Internal Medicine

## 2018-07-10 ENCOUNTER — Ambulatory Visit: Payer: BLUE CROSS/BLUE SHIELD | Admitting: Internal Medicine

## 2019-04-03 IMAGING — CT CT CHEST W/ CM
2 of 3 series · 14 of 46 positions shown, 16 images · IV contrast (ISOVUE 300)
Comparison: Chest radiograph, 01/06/2017

CLINICAL DATA: Prior chest radiograph showed hilar adenopathy
suspicious for sarcoidosis. CT for further evaluation.

EXAM:
CT CHEST, ABDOMEN, AND PELVIS WITH CONTRAST
TECHNIQUE: Multidetector CT imaging of the chest, abdomen and pelvis was
performed following the standard protocol during bolus
administration of intravenous contrast.
CONTRAST:  100mL [id number]-QWW IOPAMIDOL ([id number]-QWW) INJECTION 61%

[Series 2: cap with · axial · 0.84mm/px · z∈[-570,+4]mm · 11 of 133 slices shown, 13 images]
[im 9/133  soft-tissue]
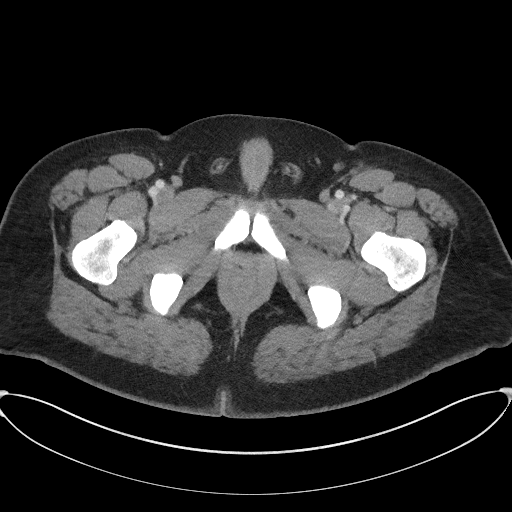
[im 9/133  bone]
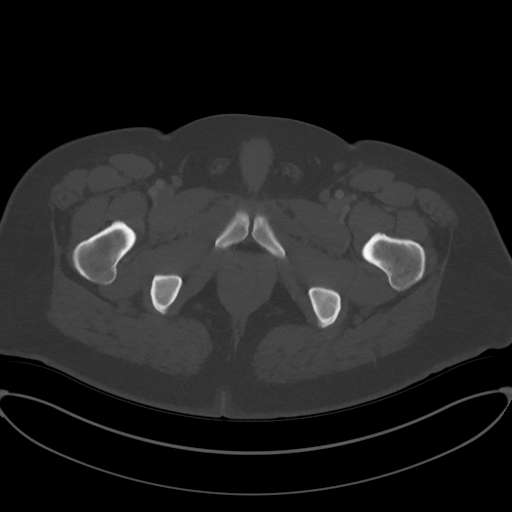
[im 22/133  soft-tissue]
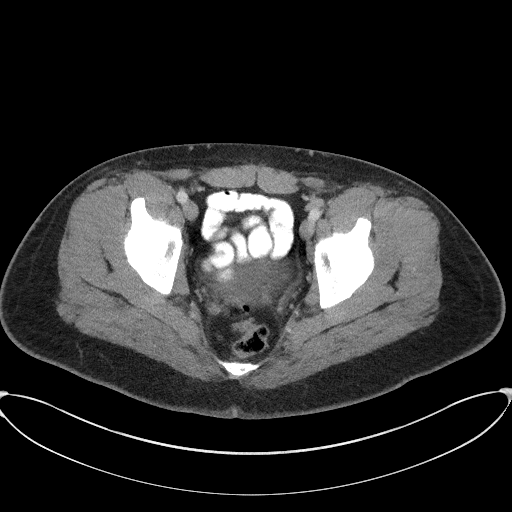
[im 30/133  soft-tissue]
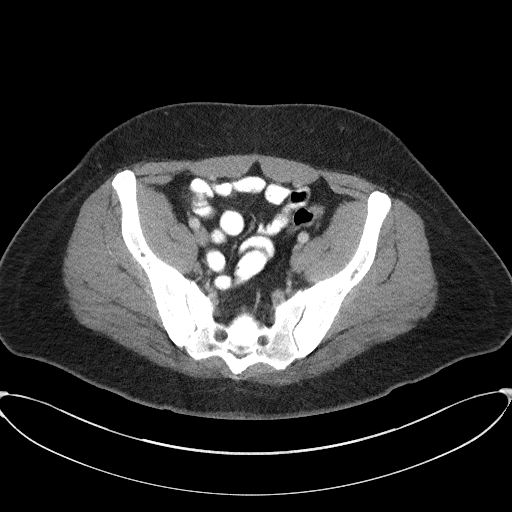
[im 43/133  soft-tissue]
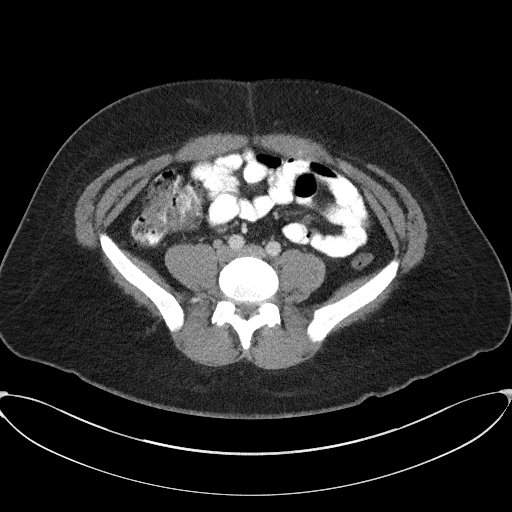
[im 56/133  soft-tissue]
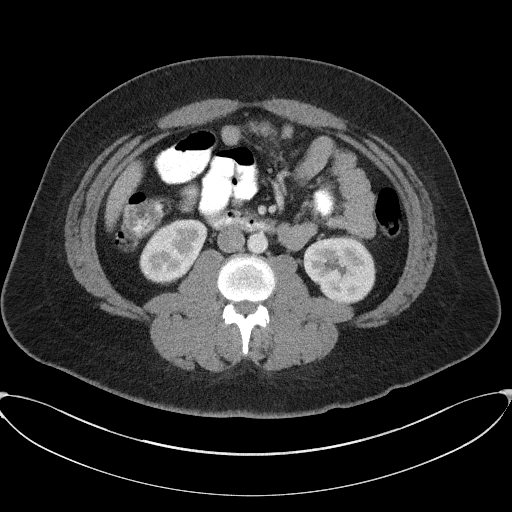
[im 69/133  soft-tissue]
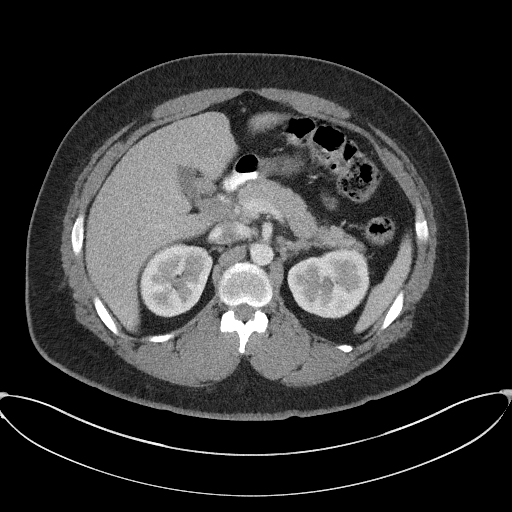
[im 77/133  soft-tissue]
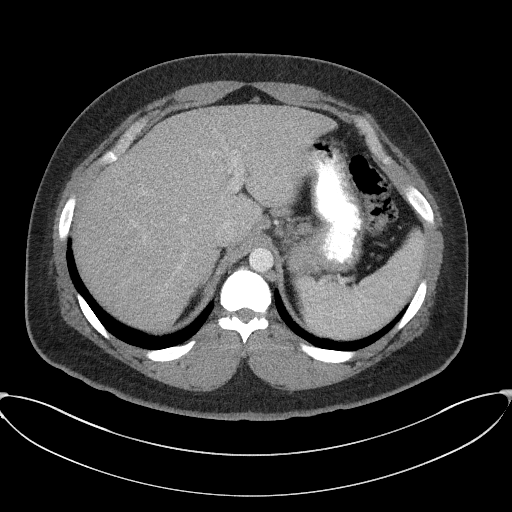
[im 90/133  soft-tissue]
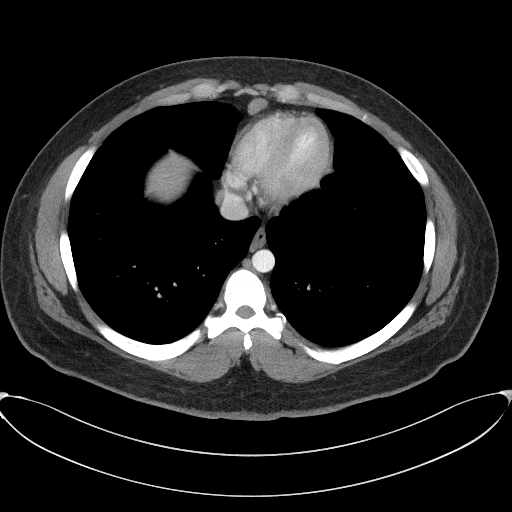
[im 103/133  soft-tissue]
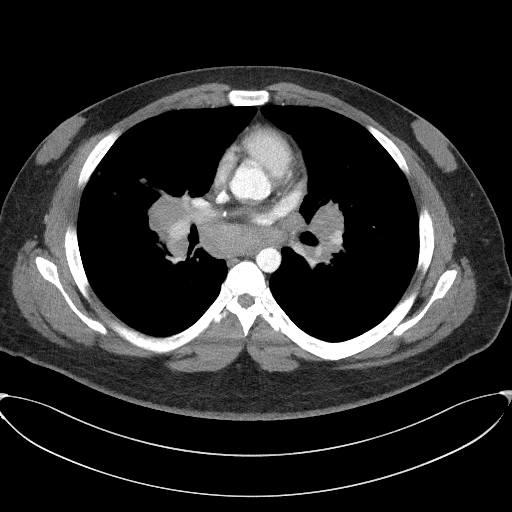
[im 103/133  bone]
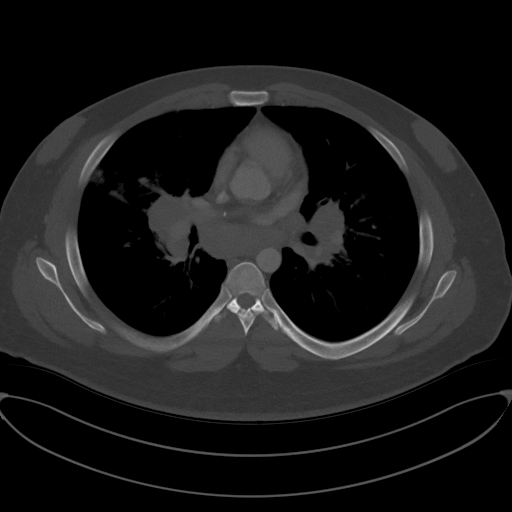
[im 111/133  soft-tissue]
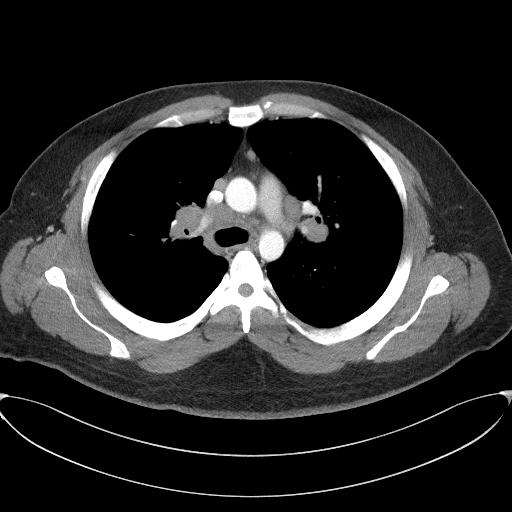
[im 124/133  soft-tissue]
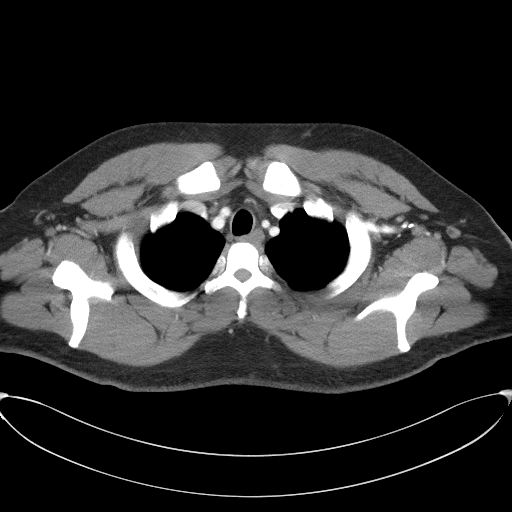

[Series 5: coronal · coronal · 0.76mm/px · 3 of 161 slices shown]
[im 54/161  soft-tissue]
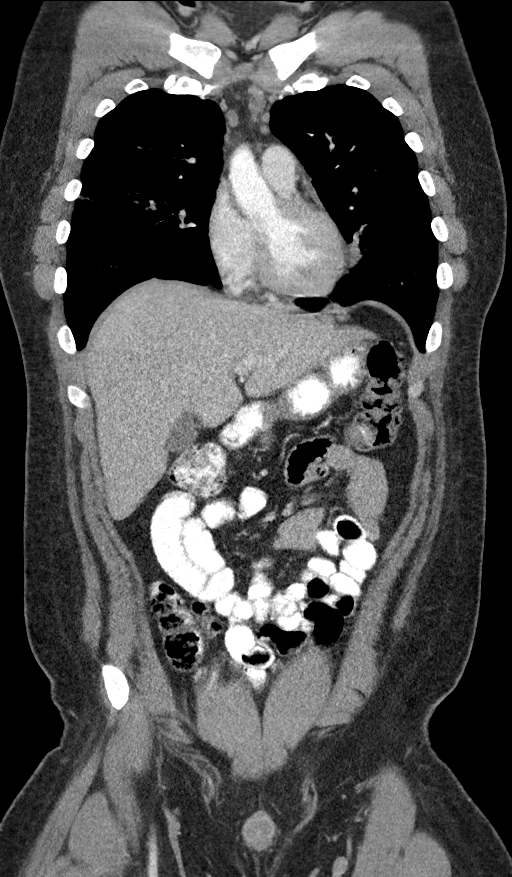
[im 72/161  soft-tissue]
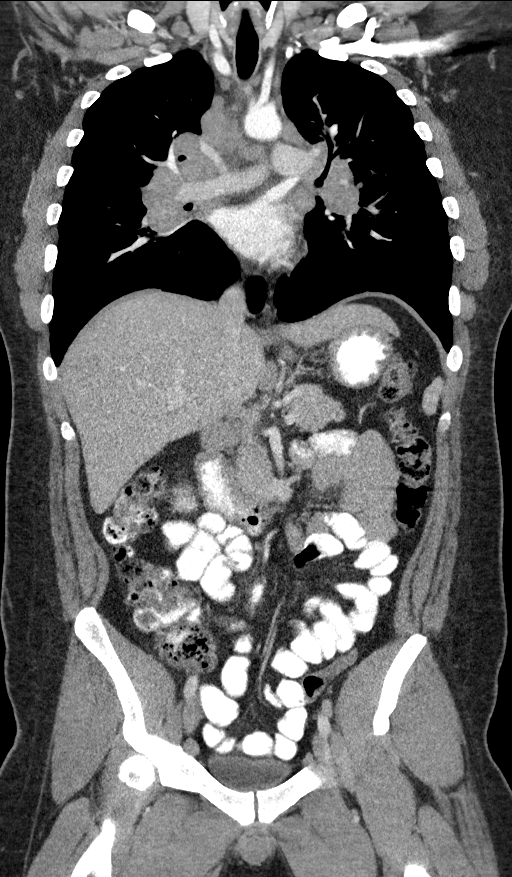
[im 89/161  soft-tissue]
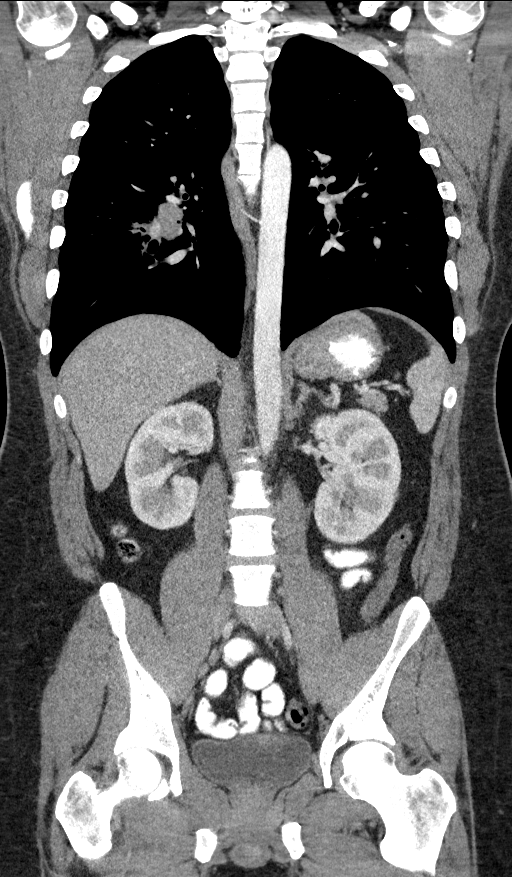

[14 of 46 positions shown; findings below may reference images not displayed]

FINDINGS: CT CHEST FINDINGS

Cardiovascular: Heart is normal in size and configuration. No
pericardial effusion. Great vessels are within normal limits.

Mediastinum/Nodes: There is bulky mediastinal and hilar adenopathy.
Several reference measurements were made. There is a right
peritracheal lymph node just above the azygos arch measuring 2.7 cm
in short axis. A subcarinal node measures 2.5 cm in short axis.
Right superior hilar lymph node measures 2.5 cm in short axis. Left
infrahilar node measures 2.5 cm in short axis. No neck base or
axillary masses or enlarged lymph nodes. Trachea is widely patent.
Esophagus is unremarkable.

Lungs/Pleura: There are reticulonodular type lung opacities with
hazy intervening opacity, most evident in the upper lobes. There are
additional patchy areas of consolidation adjacent to fissures. There
is an area of pleural-based consolidation in the right lower lobe
centered on image 99. No evidence of pulmonary edema. No pleural
effusion or pneumothorax.

Chest wall:  Bilateral gynecomastia.

CT ABDOMEN PELVIS FINDINGS

Hepatobiliary: No focal liver abnormality is seen. No gallstones,
gallbladder wall thickening, or biliary dilatation.

Pancreas: Unremarkable. No pancreatic ductal dilatation or
surrounding inflammatory changes.

Spleen: Normal in size without focal abnormality.

Adrenals/Urinary Tract: Adrenal glands are unremarkable. Kidneys are
normal, without renal calculi, focal lesion, or hydronephrosis.
Bladder is unremarkable.

Stomach/Bowel: Stomach is within normal limits. Appendix appears
normal. No evidence of bowel wall thickening, distention, or
inflammatory changes.

Vascular/Lymphatic: No vascular abnormality.

There are prominent periceliac lymph nodes, largest measuring 11 mm
in short axis. Mildly enlarged right inguinal lymph node measuring
13 mm in short axis prominent external iliac chain lymph nodes,
largest on the left measuring 9 mm. No other adenopathy.

Reproductive: Normal.

Other: No abdominal wall hernia or abnormality. No abdominopelvic
ascites.

MUSCULOSKELETAL FINDINGS

Mild endplate spurring in the lower thoracic spine. No fracture or
acute finding. No osteoblastic or osteolytic lesions.
IMPRESSION: CHEST CT

1. Bulky mediastinal and bilateral hilar adenopathy associated with
reticulonodular opacities and patchy areas consolidation in the
lungs with an upper lobe predominance. Findings are consistent with
stage II sarcoidosis with lymph node enlargement and parenchymal
lung disease.
2. Bilateral gynecomastia.
3. No other abnormalities on the chest CT.

ABDOMEN AND PELVIS CT

1. No acute findings within the abdomen or pelvis.
2. Mild adenopathy noted in the inguinal regions and pelvis and
along the periceliac chain.
3. No other abnormalities in the abdomen or pelvis.

## 2019-07-07 ENCOUNTER — Ambulatory Visit (INDEPENDENT_AMBULATORY_CARE_PROVIDER_SITE_OTHER): Payer: BC Managed Care – PPO | Admitting: Internal Medicine

## 2019-07-07 ENCOUNTER — Encounter: Payer: Self-pay | Admitting: Internal Medicine

## 2019-07-07 ENCOUNTER — Other Ambulatory Visit: Payer: Self-pay

## 2019-07-07 ENCOUNTER — Ambulatory Visit (INDEPENDENT_AMBULATORY_CARE_PROVIDER_SITE_OTHER): Payer: BC Managed Care – PPO

## 2019-07-07 DIAGNOSIS — F17210 Nicotine dependence, cigarettes, uncomplicated: Secondary | ICD-10-CM

## 2019-07-07 DIAGNOSIS — D860 Sarcoidosis of lung: Secondary | ICD-10-CM

## 2019-07-07 LAB — HEPATIC FUNCTION PANEL
ALT: 26 U/L (ref 0–53)
AST: 25 U/L (ref 0–37)
Albumin: 4.6 g/dL (ref 3.5–5.2)
Alkaline Phosphatase: 100 U/L (ref 39–117)
Bilirubin, Direct: 0.1 mg/dL (ref 0.0–0.3)
Total Bilirubin: 0.5 mg/dL (ref 0.2–1.2)
Total Protein: 7.5 g/dL (ref 6.0–8.3)

## 2019-07-07 LAB — BASIC METABOLIC PANEL
BUN: 13 mg/dL (ref 6–23)
CO2: 27 mEq/L (ref 19–32)
Calcium: 9.6 mg/dL (ref 8.4–10.5)
Chloride: 104 mEq/L (ref 96–112)
Creatinine, Ser: 0.82 mg/dL (ref 0.40–1.50)
GFR: 133.22 mL/min (ref >60.00)
Glucose, Bld: 80 mg/dL (ref 70–99)
Potassium: 3.8 mEq/L (ref 3.5–5.1)
Sodium: 138 mEq/L (ref 135–145)

## 2019-07-07 LAB — CBC WITH DIFFERENTIAL/PLATELET
Basophils Absolute: 0.1 10*3/uL (ref 0.0–0.1)
Basophils Relative: 0.8 % (ref 0.0–3.0)
Eosinophils Absolute: 0.2 10*3/uL (ref 0.0–0.7)
Eosinophils Relative: 2.5 % (ref 0.0–5.0)
HCT: 41.7 % (ref 39.0–52.0)
Hemoglobin: 13.1 g/dL (ref 13.0–17.0)
Lymphocytes Relative: 37.3 % (ref 12.0–46.0)
Lymphs Abs: 3.6 10*3/uL (ref 0.7–4.0)
MCHC: 31.5 g/dL (ref 30.0–36.0)
MCV: 73.2 fl — ABNORMAL LOW (ref 78.0–100.0)
Monocytes Absolute: 0.6 10*3/uL (ref 0.1–1.0)
Monocytes Relative: 6.5 % (ref 3.0–12.0)
Neutro Abs: 5.1 10*3/uL (ref 1.4–7.7)
Neutrophils Relative %: 52.9 % (ref 43.0–77.0)
Platelets: 248 10*3/uL (ref 150.0–400.0)
RBC: 5.7 Mil/uL (ref 4.22–5.81)
RDW: 16.3 % — ABNORMAL HIGH (ref 11.5–15.5)
WBC: 9.6 10*3/uL (ref 4.0–10.5)

## 2019-07-07 LAB — SEDIMENTATION RATE: Sed Rate: 16 mm/hr — ABNORMAL HIGH (ref 0–15)

## 2019-07-07 LAB — TSH: TSH: 2.99 u[IU]/mL (ref 0.35–4.50)

## 2019-07-07 NOTE — Progress Notes (Signed)
Subjective:     Patient ID: Ronald Walton, male   DOB: 1990/10/03,    MRN: [id number]   Brief patient profile:  28yobm active smoker originally presented with cough and polyarthritis. with dx of sarcoid 03/19/17 rx pred started at that point    History of Present Illness  06/12/2017  transition of care ext f/u ov/Ronald Walton re: sarcoid management/ chronic cough formerly Scientist, clinical (histocompatibility and immunogenetics) Complaint  Patient presents with  . new consult    sarcoidosis, coughing    26 yowbm quit smoking 12/2016 with new onset polyarthritis 10/2016  Then wt loss and cough >  Dx as sarcoid by lung by mediastinioscopy bx 03/19/17 > rx 10 mg daily >>  Wt loss / arthritis gone all that's left is the cough which is worse with temp change and worse at hs but does not keep him up all night assoc with nasal congestion / anosmia/ some better with zyrtec Not limited by breathing from desired activities   rec Prednisone 10 mg x 2 with bfast until 100% better then one daily  Pantoprazole (protonix) 40 mg   Take  30-60 min before first meal of the day and Pepcid (famotidine)  20 mg one @  bedtime until return to office - this is the best way to tell whether stomach acid is contributing to your problem.   GERD diet  W/u  Allergy profile 06/12/2017 >  Eos 0.1 /  IgE  43 RAST neg - sinus CT 06/20/2017 >>> Fluid level in the left maxillary sinus compatible with acute sinusitis > Augmentin 875 mg take one pill twice daily  X 20 days   - Repeat sinus ct p rx 07/11/2017  : still small fluid level L max sinus but improved vs pre rx      07/11/2017  f/u ov/Ronald Walton re: sarcoid still on 20 mg daiy  Chief Complaint  Patient presents with  . Follow-up    PFT's done today. Breathing has improved and he is coughing less. No new co's today.    Dyspnea:  No limted not working out again yet  Cough:  Still tickle with deep breath/ not hs/ sleeping fine/ can smell better now    arthritis gone  Completely also  rec Try prednisone 20 mg even days  and 10 mg odd days x 2 weeks then 10 mg daily if exactly the same     08/22/2017  f/u ov/Ronald Walton re:  Cough and arthritis ankles and feet resolved and stayed gone on taper  to 10 mg daily  Chief Complaint  Patient presents with  . Follow-up    Cough has resolved.  Dyspnea:  Not limited by breathing from desired activities  Including multiple flights of steps Cough: .none Sleep: no problem SABA use:  None rec Stay on 10 mg per day through April and then start one half daily in May  Please remember to go to the lab department downstairs in the basement  for your tests - we will call you with the results when they are available   10/03/2017  f/u ov/Ronald Walton re: sarcoid on "one pill a day" pred ? Strength (only received rx for 10 mg tabs from this office) Chief Complaint  Patient presents with  . Follow-up    Breathing is doing well. He has occ cough with clear sputum.    not clear how much prednisone he's taking but reduced from 2 pills to one 1st of may 2019 and staring to not some cough and  some aching   Not limited by breathing from desired activities   Min dry daytime cough like a throat tickle/ none noct - had resolved on pred higher dose Min knee aches since decreased prednisone rec No change rx (turns out was prednisone - 5 mg one tablet daily     01/08/2018  f/u ov/Ronald Walton re: sarcoidosis presented acutely with arthritis, no flare off pred 5 mg daily x one month  Chief Complaint  Patient presents with  . Follow-up    pt doing well, no current breathing complaints.  pt has not taken prednisone X88month.   Dyspnea:  Only after 3 flights and does not need to stop Cough: none Sleeping: fine one pillow/ flat bed  SABA use: none 02: none   rec If your symptoms happen again, prednisone dose should be 10 mg daily until better then 5 mg daily thereafter  For cough >  Try Pantoprazole (protonix) 40 mg   Take  30-60 min before first meal of the day and Pepcid (famotidine)  20 mg one @   bedtime until return to office - this is the best way to tell whether stomach acid is contributing to your problem.   GERD diet  - 12/08/17 d/c'd pred on his own   07/07/2019  f/u ov/Ronald Walton re: sarcoid f/u  - no pred in months  s flare arthritis  Chief Complaint  Patient presents with  . Follow-up    Breathing is doing well and no new co's today.   Dyspnea:  Not limited by breathing from desired activities   Cough: none  Sleeping: on side / bed flat 2 pillows  SABA use: none 02: none    No obvious day to day or daytime variability or assoc excess/ purulent sputum or mucus plugs or hemoptysis or cp or chest tightness, subjective wheeze or overt sinus or hb symptoms.   Sleeping  without nocturnal  or early am exacerbation  of respiratory  c/o's or need for noct saba. Also denies any obvious fluctuation of symptoms with weather or environmental changes or other aggravating or alleviating factors except as outlined above   No unusual exposure hx or h/o childhood pna/ asthma or knowledge of premature birth.  Current Allergies, Complete Past Medical History, Past Surgical History, Family History, and Social History were reviewed in [name] record.  ROS  The following are not active complaints unless bolded Hoarseness, sore throat, dysphagia, dental problems, itching, sneezing,  nasal congestion or discharge of excess mucus or purulent secretions, ear ache,   fever, chills, sweats, unintended wt loss or wt gain, classically pleuritic or exertional cp,  orthopnea pnd or arm/hand swelling  or leg swelling, presyncope, palpitations, abdominal pain, anorexia, nausea, vomiting, diarrhea  or change in bowel habits or change in bladder habits, change in stools or change in urine, dysuria, hematuria,  rash, arthralgias, visual complaints, headache, numbness, weakness or ataxia or problems with walking or coordination,  change in mood or  memory.        Current Meds  Medication  Sig  . acetaminophen (TYLENOL) 500 MG tablet Take 1,000 mg by mouth every 8 (eight) hours as needed for mild pain.  . Multiple Vitamin (MULTIVITAMIN WITH MINERALS) TABS tablet Take 1 tablet by mouth daily. Takes when he remembers                            Objective:   Physical Exam  07/07/2019       292  01/08/2018       295  10/03/2017       280  07/11/2017       269   06/12/17 259 lb (117.5 kg)  04/08/17 238 lb (108 kg)  03/19/17 238 lb (108 kg)      Vital signs reviewed  07/07/2019  - Note at rest 02 sats  98% on RA   HEENT : pt wearing mask not removed for exam due to covid -19 concerns.    NECK :  without JVD/Nodes/TM/ nl carotid upstrokes bilaterally   LUNGS: no acc muscle use,  Nl contour chest which is clear to A and P bilaterally without cough on insp or exp maneuvers   CV:  RRR  no s3 or murmur or increase in P2, and no edema   ABD:  soft and nontender with nl inspiratory excursion in the supine position. No bruits or organomegaly appreciated, bowel sounds nl  MS:  Nl gait/ ext warm without deformities, calf tenderness, cyanosis or clubbing No obvious joint restrictions   SKIN: warm and dry without lesions    NEURO:  alert, approp, nl sensorium with  no motor or cerebellar deficits apparent.        CXR PA and Lateral:   07/07/2019 :    I personally reviewed images and agree with radiology impression as follows:   Previously noted irregular parenchymal opacities in the perihilar aspects of the lungs have significantly decreased compared to the prior study. No acute consolidative airspace disease. No pleural effusions. No pneumothorax. Heart size is normal. Mild bilateral hilar prominence, similar to the prior examination   Labs ordered/ reviewed:      Chemistry      Component Value Date/Time   NA 138 07/07/2019 1135   K 3.8 07/07/2019 1135   CL 104 07/07/2019 1135   CO2 27 07/07/2019 1135   BUN 13 07/07/2019 1135   CREATININE 0.82  07/07/2019 1135      Component Value Date/Time   CALCIUM 9.6 07/07/2019 1135   ALKPHOS 100 07/07/2019 1135   AST 25 07/07/2019 1135   ALT 26 07/07/2019 1135   BILITOT 0.5 07/07/2019 1135        Lab Results  Component Value Date   WBC 9.6 07/07/2019   HGB 13.1 07/07/2019   HCT 41.7 07/07/2019   MCV 73.2 (L) 07/07/2019   PLT 248.0 07/07/2019         Lab Results  Component Value Date   TSH 2.99 07/07/2019          Lab Results  Component Value Date   ESRSEDRATE 16 (H) 07/07/2019   ESRSEDRATE 35 (H) 10/03/2017   ESRSEDRATE 16 (H) 08/22/2017        Labs ordered 07/07/2019  :  ACE        Assessment:

## 2019-07-07 NOTE — Patient Instructions (Addendum)
Weight control is simply a matter of calorie balance which needs to be tilted in your favor by eating less and exercising more.  To get the most out of exercise, you need to be continuously aware that you are short of breath, but never out of breath, for 30 minutes daily. As you improve, it will actually be easier for you to do the same amount of exercise  in  30 minutes so always push to the level where you are short of breath.  If this does not result in gradual weight reduction then I strongly recommend you see a nutritionist with a food diary x 2 weeks so that we can work out a negative calorie balance which is universally effective in steady weight loss programs.  Think of your calorie balance like you do your bank account where in this case you want the balance to go down so you must take in less calories than you burn up.  It's just that simple:  Hard to do, but easy to understand.  Good luck!    Please remember to go to the lab and x-ray department   for your tests - we will call you with the results when they are available.  Need to have annual eye exam   The key is to stop smoking completely before smoking completely stops you!        Please schedule a follow up visit in 12 months but call sooner if needed

## 2019-07-08 ENCOUNTER — Encounter: Payer: Self-pay | Admitting: Internal Medicine

## 2019-07-08 DIAGNOSIS — F17210 Nicotine dependence, cigarettes, uncomplicated: Secondary | ICD-10-CM | POA: Insufficient documentation

## 2019-07-08 NOTE — Assessment & Plan Note (Signed)
Counseled re importance of smoking cessation but did not meet time criteria for separate billing   °

## 2019-07-08 NOTE — Assessment & Plan Note (Signed)
Body mass index is 39.6 kg/m.  -  trending down slightly/ encouraged  Lab Results  Component Value Date   TSH 2.99 07/07/2019     Contributing to gerd risk/ doe/reviewed the need and the process to achieve and maintain neg calorie balance > defer f/u primary care including intermittently monitoring thyroid status

## 2019-07-08 NOTE — Assessment & Plan Note (Addendum)
Mediastinoscopy bx 03/19/17 > pred 10 mg daily improved all symptoms x cough/rhinitis - 06/12/2017 increased pred to 20 mg until better then back to 10 mg daily  - 06/12/2017  Angiotensin level = 72 on pred 10 mg daily vs 02/14/17 = 72 on none - 06/12/2017  rec prednisone 20 mg day > started taper 07/11/17 - PFT's  07/11/2017  FVC  3.91 (82%)  p no % improvement from saba p nothing prior to study with DLCO  89/89c % corrects to 118 % for alv volume  - 08/22/2017  Esr = 16   @ ?  10 mg daily  So rec reduce by half May 2019 but turns out probably was on 20 mg on 08/22/17 as misunderstood instructions  -  10/03/2017  ESR =  35    @ 10 mg dialy And some cough/ aches in knees > no change rx  - 12/08/17 d/c'd pred on his own > no evidence of active dz 07/07/2019 so rec opth yearly, pulmonary f/u yearly and prn         Each maintenance medication was reviewed in detail including emphasizing most importantly the difference between maintenance and prns and under what circumstances the prns are to be triggered using an action plan format where appropriate.  Total time for H and P, chart review, counseling, teaching device and generating customized AVS unique to this office visit / charting = 30 min

## 2019-07-09 LAB — ANGIOTENSIN CONVERTING ENZYME: Angiotensin-Converting Enzyme: 13 U/L (ref 9–67)

## 2019-07-12 NOTE — Progress Notes (Signed)
LMTCB

## 2019-07-13 ENCOUNTER — Telehealth: Payer: Self-pay | Admitting: Internal Medicine

## 2019-07-13 NOTE — Telephone Encounter (Signed)
Nyoka Cowden, MD  07/09/2019 1:42 PM EST    Call patient : Study is unremarkable, no evidence of systemic sarcoidosis but be sure to keep eye appts as we discussed    Nyoka Cowden, MD  07/07/2019 4:39 PM EST    Call pt: Reviewed cxr and some better than prior so no change in recommendations made at [facility] and spoke with pt letting him know the results of the labwork and cxr. Pt verbalized understanding. Nothing further needed.

## 2019-07-31 IMAGING — CT CT PARANASAL SINUSES LIMITED
1 of 2 series · 8 of 11 positions shown, 10 images · non-contrast
Comparison: None.

CLINICAL DATA: 26-year-old male with 1 year of intermittent
productive cough, sinus congestion, and drainage. Decreased sense of
smell. Sarcoidosis.

EXAM:
CT PARANASAL SINUS LIMITED WITHOUT CONTRAST
TECHNIQUE: Non-contiguous multidetector CT images of the paranasal sinuses were
obtained in a single plane without contrast.

[Series 4: limited sinus st · axial · 0.24mm/px · z∈[+125,+195]mm · 8 of 10 slices shown, 10 images]
[im 2/10  brain]
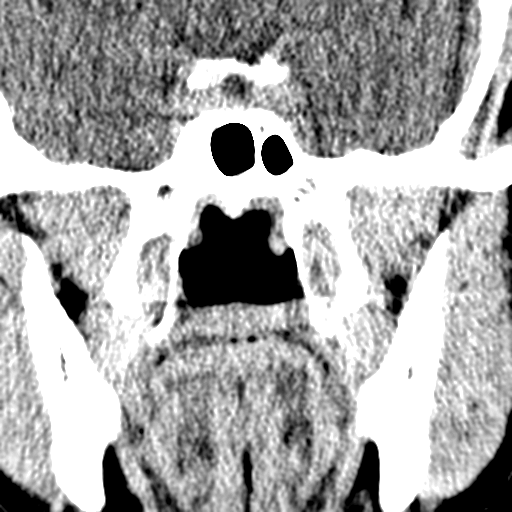
[im 2/10  bone]
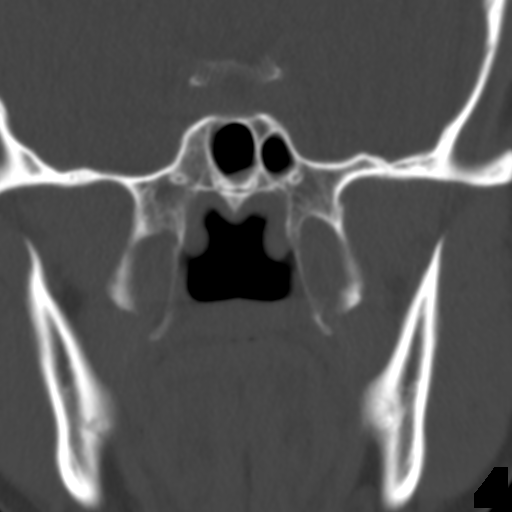
[im 3/10  bone]
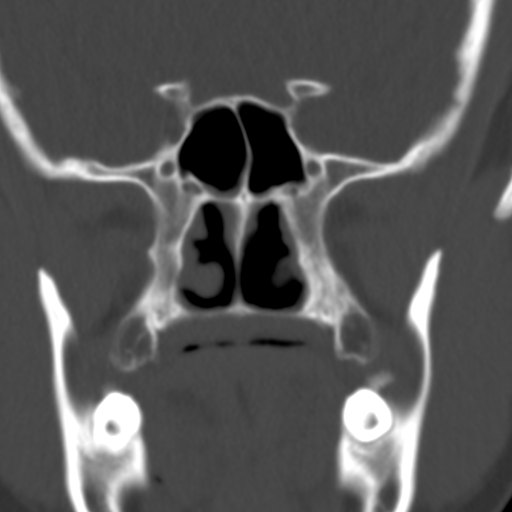
[im 4/10  bone]
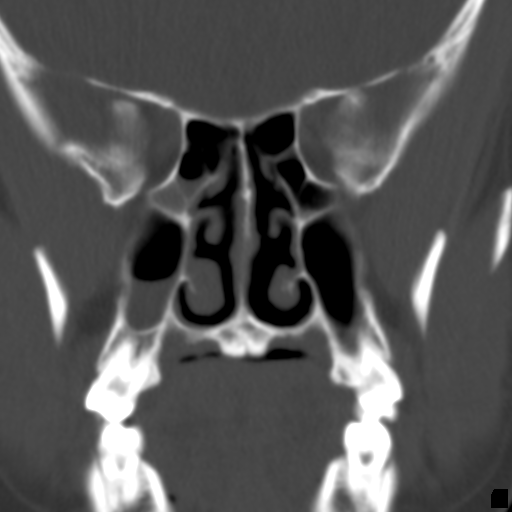
[im 5/10  bone]
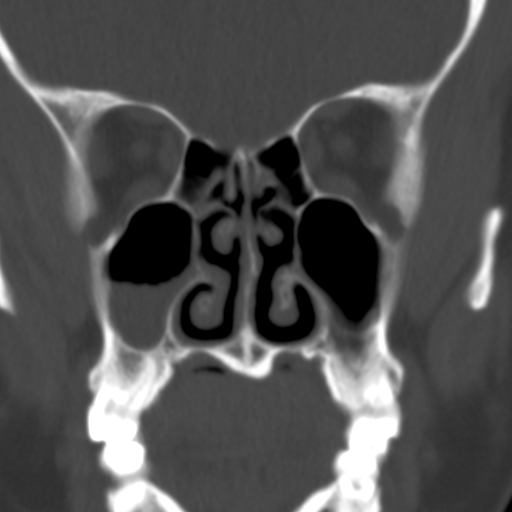
[im 6/10  brain]
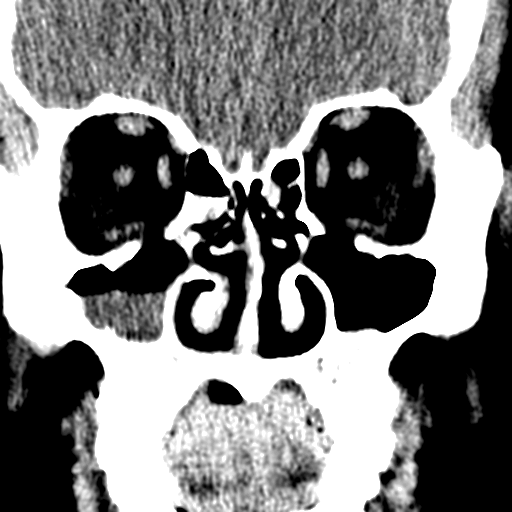
[im 6/10  bone]
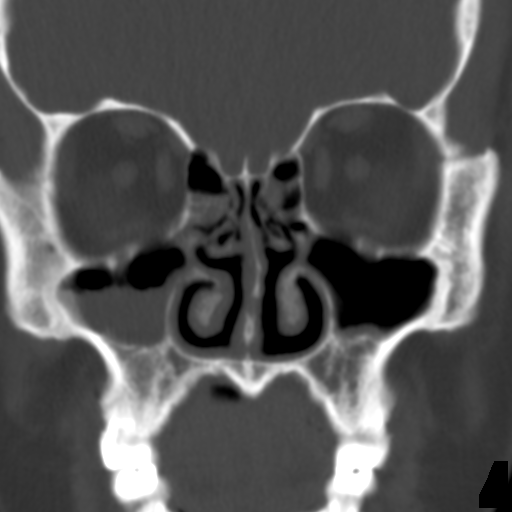
[im 7/10  bone]
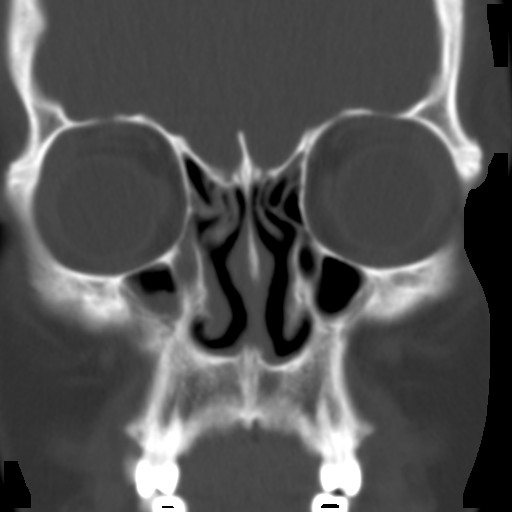
[im 8/10  bone]
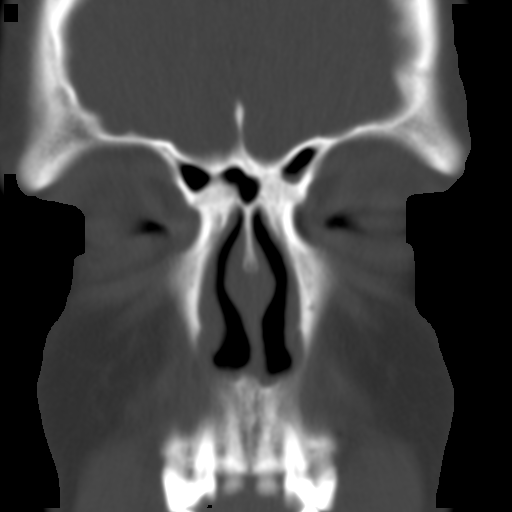
[im 9/10  bone]
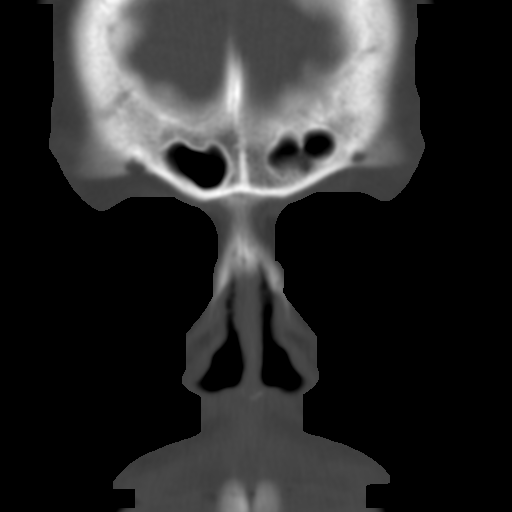

[8 of 11 positions shown; findings below may reference images not displayed]

FINDINGS: Negative visible noncontrast brain parenchyma. Negative visible
noncontrast orbit and face soft tissues.

Normal bone mineralization at the skull base.

Moderate volume fluid layering in the left maxillary sinus (series
3, image 5). Only mild mucosal thickening in the medial and inferior
right maxillary sinus. Mild to moderate mucosal thickening in the
ethmoid air cells greater on the left. Mild mucosal thickening along
the floor both sphenoid sinuses. The frontal sinuses are somewhat
hypoplastic. There is mild to moderate right frontal sinus mucosal
thickening.

The nasal cavity is patent with somewhat atrophic appearing nasal
cavity mucosa. The visible septum is intact.
IMPRESSION: Fluid level in the left maxillary sinus compatible with acute
sinusitis. Predominantly mild bilateral paranasal sinus mucosal
thickening elsewhere.

## 2020-07-06 ENCOUNTER — Ambulatory Visit: Payer: BC Managed Care – PPO | Admitting: Internal Medicine

## 2020-07-07 ENCOUNTER — Ambulatory Visit: Payer: BC Managed Care – PPO | Admitting: Internal Medicine

## 2021-03-14 ENCOUNTER — Encounter (HOSPITAL_COMMUNITY): Payer: Self-pay

## 2021-03-14 ENCOUNTER — Emergency Department (HOSPITAL_COMMUNITY): Payer: BC Managed Care – PPO

## 2021-03-14 ENCOUNTER — Emergency Department (HOSPITAL_COMMUNITY)
Admission: EM | Admit: 2021-03-14 | Discharge: 2021-03-14 | Disposition: A | Discharge status: Home / Self Care | Payer: BC Managed Care – PPO | Attending: Student | Admitting: Student

## 2021-03-14 DIAGNOSIS — Z20822 Contact with and (suspected) exposure to covid-19: Secondary | ICD-10-CM | POA: Insufficient documentation

## 2021-03-14 DIAGNOSIS — E860 Dehydration: Secondary | ICD-10-CM | POA: Insufficient documentation

## 2021-03-14 DIAGNOSIS — F17210 Nicotine dependence, cigarettes, uncomplicated: Secondary | ICD-10-CM | POA: Diagnosis not present

## 2021-03-14 DIAGNOSIS — J111 Influenza due to unidentified influenza virus with other respiratory manifestations: Secondary | ICD-10-CM | POA: Diagnosis not present

## 2021-03-14 DIAGNOSIS — R059 Cough, unspecified: Secondary | ICD-10-CM | POA: Diagnosis present

## 2021-03-14 LAB — CBC WITH DIFFERENTIAL/PLATELET
Abs Immature Granulocytes: 0.01 10*3/uL (ref 0.00–0.07)
Basophils Absolute: 0 10*3/uL (ref 0.0–0.1)
Basophils Relative: 1 %
Eosinophils Absolute: 0 10*3/uL (ref 0.0–0.5)
Eosinophils Relative: 0 %
HCT: 44.8 % (ref 39.0–52.0)
Hemoglobin: 14.5 g/dL (ref 13.0–17.0)
Immature Granulocytes: 0 %
Lymphocytes Relative: 19 %
Lymphs Abs: 1.1 10*3/uL (ref 0.7–4.0)
MCH: 23.3 pg — ABNORMAL LOW (ref 26.0–34.0)
MCHC: 32.4 g/dL (ref 30.0–36.0)
MCV: 71.9 fL — ABNORMAL LOW (ref 80.0–100.0)
Monocytes Absolute: 0.6 10*3/uL (ref 0.1–1.0)
Monocytes Relative: 10 %
Neutro Abs: 4.1 10*3/uL (ref 1.7–7.7)
Neutrophils Relative %: 70 %
Platelets: 213 10*3/uL (ref 150–400)
RBC: 6.23 MIL/uL — ABNORMAL HIGH (ref 4.22–5.81)
RDW: 17.6 % — ABNORMAL HIGH (ref 11.5–15.5)
WBC: 5.8 10*3/uL (ref 4.0–10.5)
nRBC: 0 % (ref 0.0–0.2)

## 2021-03-14 LAB — COMPREHENSIVE METABOLIC PANEL
ALT: 36 U/L (ref 0–44)
AST: 53 U/L — ABNORMAL HIGH (ref 15–41)
Albumin: 4.1 g/dL (ref 3.5–5.0)
Alkaline Phosphatase: 84 U/L (ref 38–126)
Anion gap: 12 (ref 5–15)
BUN: 14 mg/dL (ref 6–20)
CO2: 17 mmol/L — ABNORMAL LOW (ref 22–32)
Calcium: 8.6 mg/dL — ABNORMAL LOW (ref 8.9–10.3)
Chloride: 108 mmol/L (ref 98–111)
Creatinine, Ser: 1.11 mg/dL (ref 0.61–1.24)
GFR, Estimated: >60 mL/min (ref >60)
Glucose, Bld: 111 mg/dL — ABNORMAL HIGH (ref 70–99)
Potassium: 3.3 mmol/L — ABNORMAL LOW (ref 3.5–5.1)
Sodium: 137 mmol/L (ref 135–145)
Total Bilirubin: 0.4 mg/dL (ref 0.3–1.2)
Total Protein: 7.5 g/dL (ref 6.5–8.1)

## 2021-03-14 LAB — RESP PANEL BY RT-PCR (FLU A&B, COVID) ARPGX2
Influenza A by PCR: POSITIVE — AB
Influenza B by PCR: NEGATIVE
SARS Coronavirus 2 by RT PCR: NEGATIVE

## 2021-03-14 LAB — CBG MONITORING, ED: Glucose-Capillary: 130 mg/dL — ABNORMAL HIGH (ref 70–99)

## 2021-03-14 LAB — LIPASE, BLOOD: Lipase: 34 U/L (ref 11–51)

## 2021-03-14 MED ORDER — SODIUM CHLORIDE 0.9 % IV BOLUS
1000.0000 mL | Freq: Once | INTRAVENOUS | Status: AC
  Administered 2021-03-14: 1000 mL via INTRAVENOUS

## 2021-03-14 NOTE — ED Provider Notes (Signed)
Emergency Medicine Provider Triage Evaluation Note  Ronald Walton , a 30 y.o. male  was evaluated in triage.  Pt complains of diarrhea and dizziness.  Patient states a few days ago he developed fever and cough.  This was constant for few days, but since has resolved.  He now is having diarrhea, and every time he stands up he feels like he is going to pass out.  Review of Systems  Positive: Cough and fever (resolved), diarrhea, lightheadedness Negative: Abd pain  Physical Exam  There were no vitals taken for this visit. Gen:   Awake, no distress   Resp:  Normal effort  MSK:   Moves extremities without difficulty  Other:  No ttp of abd  Medical Decision Making  Medically screening exam initiated at 12:57 PM.  Appropriate orders placed.  Lynda Rainwater was informed that the remainder of the evaluation will be completed by another provider, this initial triage assessment does not replace that evaluation, and the importance of remaining in the ED until their evaluation is complete.  Labs, resp panel, cxr   Alveria Apley, PA-C 03/14/21 1302    Kommor, Wyn Forster, MD 03/14/21 [phone]

## 2021-03-14 NOTE — Discharge Instructions (Signed)
Your flu test came back positive, this is the cause of your symptoms.  This is a viral illness that will need to be treated symptomatically. Use Tylenol ibuprofen as needed for fever or pain. Make sure you are staying well-hydrated water.  Your urine should be clear to pale yellow. Use Imodium as needed for diarrhea. Return to the emergency room if you develop severe worsening abdominal pain, persistent vomiting, blood in your stool, or any new, worsening, or concerning symptoms.

## 2021-03-14 NOTE — ED Triage Notes (Addendum)
Pt arrived via EMS, from [facility]. Per EMS, pt dizzy, fever and cough have resolved, had syncopal episode at [facility], as well as on witnessed by EMS. Pt aox4 in triage. Per EMS, pt had orthostatic changes PTA.   20 G L AC NS given en route

## 2021-03-14 NOTE — ED Provider Notes (Signed)
[facility]-EMERGENCY DEPT Provider Note   CSN: [id number] Arrival date & time: 03/14/21  1252     History Chief Complaint  Patient presents with   Loss of Consciousness   Dizziness    Ronald Walton is a 30 y.o. male presenting for evaluation of dizziness, syncope, diarrhea.  Patient states a few days ago he developed fever and cough.  The symptoms have resolved, but he is now having diarrhea.  This happens almost anytime he eats or drinks.  He has been tolerating liquids, not eating much solid food.  He denies sick contacts.  He states today he was at urgent care due to his symptoms, had a syncopal event.  He states he has been feeling dizzy and lightheaded anytime he stands up.  He denies vision changes, slurred speech, chest pain, shortness of breath, abdominal pain, urinary symptoms.  He has no other medical problems, takes no medications daily.  HPI     Past Medical History:  Diagnosis Date   Low iron    Sarcoidosis 03/20/2017    Patient Active Problem List   Diagnosis Date Noted   Cigarette smoker 07/08/2019   Morbid obesity due to excess calories (HCC) 07/08/2019   Maxillary sinusitis 06/20/2017   Pulmonary sarcoidosis (HCC) 06/12/2017   Chronic cough 06/12/2017   Hilar adenopathy 02/14/2017   Arthritis 02/14/2017   Dyspnea on exertion 02/14/2017    Past Surgical History:  Procedure Laterality Date   MEDIASTINOSCOPY N/A 03/19/2017   Procedure: MEDIASTINOSCOPY;  Surgeon: Loreli Slot, MD;  Location: [facility] OR;  Service: Thoracic;  Laterality: N/A;       Family History  Problem Relation Age of Onset   Hypertension Mother    Diabetes Father    Hypertension Brother    Sarcoidosis Cousin        Neuro Sarcoid    Lung disease Neg Hx     Social History   Tobacco Use   Smoking status: Every Day    Years: 6.00    Types: Cigarettes    Start date: 03/22/2010   Smokeless tobacco: Never   Tobacco comments:    small cigars -  smokes 2 per day  Vaping Use   Vaping Use: Never used  Substance Use Topics   Alcohol use: Yes    Comment: Social - weeks & events   Drug use: Yes    Types: Marijuana    Home Medications Prior to Admission medications   Medication Sig Start Date End Date Taking? Authorizing Provider  acetaminophen (TYLENOL) 500 MG tablet Take 1,000 mg by mouth every 8 (eight) hours as needed for mild pain.    [provider]  Multiple Vitamin (MULTIVITAMIN WITH MINERALS) TABS tablet Take 1 tablet by mouth daily. Takes when he remembers    [provider]    Allergies    Patient has no known allergies.  Review of Systems   Review of Systems  Constitutional:  Positive for fever (resolved).  Respiratory:  Positive for cough (resolved).   Gastrointestinal:  Positive for diarrhea.  Neurological:  Positive for syncope.  All other systems reviewed and are negative.  Physical Exam Updated Vital Signs BP 123/74   Pulse 82   Temp 99 F (37.2 C) (Oral)   Resp 18   SpO2 98%   Physical Exam Vitals and nursing note reviewed.  Constitutional:      General: He is not in acute distress.    Appearance: Normal appearance.  HENT:  Head: Normocephalic and atraumatic.  Eyes:     Extraocular Movements: Extraocular movements intact.     Conjunctiva/sclera: Conjunctivae normal.     Pupils: Pupils are equal, round, and reactive to light.  Cardiovascular:     Rate and Rhythm: Normal rate and regular rhythm.     Pulses: Normal pulses.  Pulmonary:     Effort: Pulmonary effort is normal. No respiratory distress.     Breath sounds: Normal breath sounds. No wheezing.     Comments: Speaking in full sentences.  Clear lung sounds in all fields. Abdominal:     General: There is no distension.     Palpations: Abdomen is soft. There is no mass.     Tenderness: There is no abdominal tenderness. There is no guarding.  Musculoskeletal:        General: Normal range of motion.     Cervical  back: Normal range of motion and neck supple.  Skin:    General: Skin is warm and dry.     Capillary Refill: Capillary refill takes less than 2 seconds.  Neurological:     Mental Status: He is alert and oriented to person, place, and time.  Psychiatric:        Mood and Affect: Mood and affect normal.        Speech: Speech normal.        Behavior: Behavior normal.    ED Results / Procedures / Treatments   Labs (all labs ordered are listed, but only abnormal results are displayed) Labs Reviewed  RESP PANEL BY RT-PCR (FLU A&B, COVID) ARPGX2 - Abnormal; Notable for the following components:      Result Value   Influenza A by PCR POSITIVE (*)    All other components within normal limits  CBC WITH DIFFERENTIAL/PLATELET - Abnormal; Notable for the following components:   RBC 6.23 (*)    MCV 71.9 (*)    MCH 23.3 (*)    RDW 17.6 (*)    All other components within normal limits  COMPREHENSIVE METABOLIC PANEL - Abnormal; Notable for the following components:   Potassium 3.3 (*)    CO2 17 (*)    Glucose, Bld 111 (*)    Calcium 8.6 (*)    AST 53 (*)    All other components within normal limits  CBG MONITORING, ED - Abnormal; Notable for the following components:   Glucose-Capillary 130 (*)    All other components within normal limits  LIPASE, BLOOD    EKG None  Radiology DG Chest 2 View  Result Date: 03/14/2021 CLINICAL DATA:  Cough EXAM: CHEST - 2 VIEW COMPARISON:  Chest x-ray 07/07/2019 FINDINGS: Heart size and mediastinal contours are within normal limits. No suspicious opacities identified. No pleural effusion or pneumothorax visualized. No acute osseous abnormality appreciated. IMPRESSION: No acute intrathoracic process identified. Electronically Signed   By: Jannifer Hick   On: 03/14/2021 14:08    Procedures Procedures   Medications Ordered in ED Medications  sodium chloride 0.9 % bolus 1,000 mL (0 mLs Intravenous Stopped 03/14/21 1837)    ED Course  I have  reviewed the triage vital signs and the nursing notes.  Pertinent labs & imaging results that were available during my care of the patient were reviewed by me and considered in my medical decision making (see chart for details).    MDM Rules/Calculators/A&P  Patient was in for evaluation of lightheadedness on standing, syncope, diarrhea.  On exam, patient appears nontoxic.  He had previous URI symptoms including fever and cough.  Likely viral illness causing dehydration and subsequent syncope/orthostatic stasis.  Will obtain labs and respiratory panel.  Will obtain EKG due to syncope.  Labs interpreted by me, overall reassuring.  Respiratory panel positive positive for flu, this is likely the cause of his symptoms.  Chest x-ray viewed and independently interpreted by me, no pneumonia pneumothorax, effusion.  EKG shows normal sinus rhythm.  On reevaluation after fluids, patient reports significant improvement of symptoms.  His orthostatics are negative at this time.  Discussed continued symptomatic management in the setting of a flu positive test.  Discussed use of Imodium to help with diarrhea, importance of hydration.  At this time, patient appears safe for discharge.  Return precautions given.  Patient states he understands and agrees to plan.  Final Clinical Impression(s) / ED Diagnoses Final diagnoses:  Flu  Dehydration    Rx / DC Orders ED Discharge Orders     None        Alveria Apley, PA-C 03/14/21 1845    Tegeler, Canary Brim, MD 03/14/21 2306

## 2021-03-14 NOTE — ED Notes (Signed)
Pt had syncopal episode while sitting in chair in triage.

## 2021-03-14 NOTE — ED Notes (Signed)
An After Visit Summary was printed and given to the patient. Discharge instructions given and no further questions at this time.
